# Patient Record
Sex: Male | Born: 1965 | Race: White | Hispanic: No | Marital: Single | Smoking: Current every day smoker
Health system: Southern US, Community
[De-identification: ages and names within clinical notes are randomized; demographics above are authoritative.]

## PROBLEM LIST (undated history)

## (undated) DIAGNOSIS — F329 Major depressive disorder, single episode, unspecified: Secondary | ICD-10-CM

## (undated) DIAGNOSIS — K219 Gastro-esophageal reflux disease without esophagitis: Secondary | ICD-10-CM

## (undated) DIAGNOSIS — F419 Anxiety disorder, unspecified: Secondary | ICD-10-CM

## (undated) DIAGNOSIS — F32A Depression, unspecified: Secondary | ICD-10-CM

## (undated) DIAGNOSIS — G8929 Other chronic pain: Secondary | ICD-10-CM

## (undated) DIAGNOSIS — M549 Dorsalgia, unspecified: Secondary | ICD-10-CM

## (undated) DIAGNOSIS — M25569 Pain in unspecified knee: Secondary | ICD-10-CM

## (undated) HISTORY — DX: Pain in unspecified knee: M25.569

## (undated) HISTORY — DX: Depression, unspecified: F32.A

## (undated) HISTORY — DX: Other chronic pain: G89.29

## (undated) HISTORY — PX: KNEE SURGERY: SHX244

## (undated) HISTORY — PX: BACK SURGERY: SHX140

## (undated) HISTORY — DX: Gastro-esophageal reflux disease without esophagitis: K21.9

## (undated) HISTORY — DX: Dorsalgia, unspecified: M54.9

## (undated) HISTORY — DX: Anxiety disorder, unspecified: F41.9

## (undated) HISTORY — DX: Major depressive disorder, single episode, unspecified: F32.9

---

## 1997-11-01 ENCOUNTER — Other Ambulatory Visit: Admission: RE | Admit: 1997-11-01 | Discharge: 1997-11-01 | Payer: Self-pay | Admitting: *Deleted

## 1997-11-06 ENCOUNTER — Other Ambulatory Visit: Admission: RE | Admit: 1997-11-06 | Discharge: 1997-11-06 | Payer: Self-pay | Admitting: *Deleted

## 1997-11-08 ENCOUNTER — Ambulatory Visit (HOSPITAL_COMMUNITY): Admission: RE | Admit: 1997-11-08 | Discharge: 1997-11-08 | Payer: Self-pay | Admitting: *Deleted

## 2000-01-28 ENCOUNTER — Emergency Department (HOSPITAL_COMMUNITY): Admission: EM | Admit: 2000-01-28 | Discharge: 2000-01-28 | Payer: Self-pay | Admitting: Emergency Medicine

## 2000-02-05 ENCOUNTER — Encounter: Payer: Self-pay | Admitting: Surgery

## 2000-02-05 ENCOUNTER — Ambulatory Visit (HOSPITAL_COMMUNITY): Admission: RE | Admit: 2000-02-05 | Discharge: 2000-02-05 | Payer: Self-pay | Admitting: Surgery

## 2000-12-24 ENCOUNTER — Emergency Department (HOSPITAL_COMMUNITY): Admission: EM | Admit: 2000-12-24 | Discharge: 2000-12-25 | Payer: Self-pay | Admitting: Emergency Medicine

## 2002-12-11 ENCOUNTER — Emergency Department (HOSPITAL_COMMUNITY): Admission: EM | Admit: 2002-12-11 | Discharge: 2002-12-11 | Payer: Self-pay | Admitting: Emergency Medicine

## 2002-12-15 ENCOUNTER — Emergency Department (HOSPITAL_COMMUNITY): Admission: EM | Admit: 2002-12-15 | Discharge: 2002-12-15 | Payer: Self-pay | Admitting: Emergency Medicine

## 2003-08-22 ENCOUNTER — Ambulatory Visit (HOSPITAL_BASED_OUTPATIENT_CLINIC_OR_DEPARTMENT_OTHER): Admission: RE | Admit: 2003-08-22 | Discharge: 2003-08-22 | Payer: Self-pay | Admitting: Orthopedic Surgery

## 2003-11-02 ENCOUNTER — Emergency Department (HOSPITAL_COMMUNITY): Admission: EM | Admit: 2003-11-02 | Discharge: 2003-11-02 | Payer: Self-pay | Admitting: Emergency Medicine

## 2005-04-17 ENCOUNTER — Emergency Department (HOSPITAL_COMMUNITY): Admission: EM | Admit: 2005-04-17 | Discharge: 2005-04-17 | Payer: Self-pay | Admitting: Family Medicine

## 2005-04-29 ENCOUNTER — Emergency Department (HOSPITAL_COMMUNITY): Admission: EM | Admit: 2005-04-29 | Discharge: 2005-04-29 | Payer: Self-pay | Admitting: Emergency Medicine

## 2005-08-02 ENCOUNTER — Emergency Department (HOSPITAL_COMMUNITY): Admission: AD | Admit: 2005-08-02 | Discharge: 2005-08-02 | Payer: Self-pay | Admitting: Family Medicine

## 2007-06-02 ENCOUNTER — Emergency Department (HOSPITAL_COMMUNITY): Admission: EM | Admit: 2007-06-02 | Discharge: 2007-06-02 | Payer: Self-pay | Admitting: Emergency Medicine

## 2008-01-16 ENCOUNTER — Emergency Department (HOSPITAL_COMMUNITY): Admission: EM | Admit: 2008-01-16 | Discharge: 2008-01-16 | Payer: Self-pay | Admitting: Emergency Medicine

## 2008-03-24 ENCOUNTER — Emergency Department (HOSPITAL_COMMUNITY): Admission: EM | Admit: 2008-03-24 | Discharge: 2008-03-24 | Payer: Self-pay | Admitting: Emergency Medicine

## 2008-03-29 ENCOUNTER — Emergency Department (HOSPITAL_COMMUNITY): Admission: EM | Admit: 2008-03-29 | Discharge: 2008-03-29 | Payer: Self-pay | Admitting: Emergency Medicine

## 2008-10-06 ENCOUNTER — Emergency Department (HOSPITAL_COMMUNITY): Admission: EM | Admit: 2008-10-06 | Discharge: 2008-10-06 | Payer: Self-pay | Admitting: Emergency Medicine

## 2009-01-13 ENCOUNTER — Emergency Department (HOSPITAL_COMMUNITY): Admission: EM | Admit: 2009-01-13 | Discharge: 2009-01-13 | Payer: Self-pay | Admitting: Emergency Medicine

## 2009-09-29 ENCOUNTER — Emergency Department (HOSPITAL_COMMUNITY): Admission: EM | Admit: 2009-09-29 | Discharge: 2009-09-29 | Payer: Self-pay | Admitting: Family Medicine

## 2009-11-15 ENCOUNTER — Emergency Department (HOSPITAL_COMMUNITY): Admission: EM | Admit: 2009-11-15 | Discharge: 2009-11-15 | Payer: Self-pay | Admitting: Emergency Medicine

## 2010-01-08 ENCOUNTER — Emergency Department (HOSPITAL_COMMUNITY): Admission: EM | Admit: 2010-01-08 | Discharge: 2010-01-08 | Payer: Self-pay | Admitting: Family Medicine

## 2010-03-12 ENCOUNTER — Encounter: Admission: RE | Admit: 2010-03-12 | Discharge: 2010-03-12 | Payer: Self-pay | Admitting: Orthopedic Surgery

## 2010-03-27 ENCOUNTER — Encounter: Admission: RE | Admit: 2010-03-27 | Discharge: 2010-03-27 | Payer: Self-pay | Admitting: Orthopedic Surgery

## 2010-07-23 ENCOUNTER — Other Ambulatory Visit (HOSPITAL_COMMUNITY): Payer: Self-pay | Admitting: Neurological Surgery

## 2010-07-23 ENCOUNTER — Encounter (HOSPITAL_COMMUNITY)
Admission: RE | Admit: 2010-07-23 | Discharge: 2010-07-23 | Disposition: A | Discharge status: Home / Self Care | Payer: BC Managed Care – PPO | Source: Ambulatory Visit | Attending: Neurological Surgery | Admitting: Neurological Surgery

## 2010-07-23 ENCOUNTER — Ambulatory Visit (HOSPITAL_COMMUNITY)
Admission: RE | Admit: 2010-07-23 | Discharge: 2010-07-23 | Disposition: A | Discharge status: Home / Self Care | Payer: BC Managed Care – PPO | Source: Ambulatory Visit | Attending: Neurological Surgery | Admitting: Neurological Surgery

## 2010-07-23 DIAGNOSIS — Z01818 Encounter for other preprocedural examination: Secondary | ICD-10-CM | POA: Insufficient documentation

## 2010-07-23 DIAGNOSIS — M545 Low back pain, unspecified: Secondary | ICD-10-CM | POA: Insufficient documentation

## 2010-07-23 DIAGNOSIS — Z01812 Encounter for preprocedural laboratory examination: Secondary | ICD-10-CM | POA: Insufficient documentation

## 2010-07-23 LAB — DIFFERENTIAL
Lymphs Abs: 1.8 10*3/uL (ref 0.7–4.0)
Neutro Abs: 3 10*3/uL (ref 1.7–7.7)

## 2010-07-23 LAB — BASIC METABOLIC PANEL
Calcium: 9.4 mg/dL (ref 8.4–10.5)
Chloride: 110 mEq/L (ref 96–112)
GFR calc Af Amer: >60 mL/min (ref >60)
Glucose, Bld: 98 mg/dL (ref 70–99)
Sodium: 141 mEq/L (ref 135–145)

## 2010-07-23 LAB — CBC
HCT: 47.3 % (ref 39.0–52.0)
MCV: 92.2 fL (ref 78.0–100.0)
Platelets: 242 10*3/uL (ref 150–400)
WBC: 5.9 10*3/uL (ref 4.0–10.5)

## 2010-07-23 LAB — PROTIME-INR
INR: 0.93 (ref 0.00–1.49)
Prothrombin Time: 12.7 seconds (ref 11.6–15.2)

## 2010-07-24 ENCOUNTER — Inpatient Hospital Stay (HOSPITAL_COMMUNITY): Payer: BC Managed Care – PPO

## 2010-07-24 ENCOUNTER — Inpatient Hospital Stay (HOSPITAL_COMMUNITY)
Admission: RE | Admit: 2010-07-24 | Discharge: 2010-07-28 | DRG: 756 | Disposition: A | Discharge status: Home / Self Care | Payer: BC Managed Care – PPO | Source: Ambulatory Visit | Attending: Neurological Surgery | Admitting: Neurological Surgery

## 2010-07-24 DIAGNOSIS — Q762 Congenital spondylolisthesis: Principal | ICD-10-CM

## 2010-07-25 NOTE — Op Note (Signed)
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  PROCEDURE: 1. Gill type decompression L5-S1 with hemifacetectomy and wide     generous foraminotomies for decompression of the L5 and the S1     nerve roots requiring much more work than is required of a simple     postprocedure to decompress the nerve roots. 2. Posterior lumbar interbody fusion L5-S1 utilizing 8 x 22 mm PEEK     interbody cages, packed with local autograft and Actifuse putty. 3. Intertransverse arthrodesis L5-S1 bilaterally utilizing local     autograft and Actifuse putty. 4. Nonsegmental fixation L5-S1 utilizing the [employer] Polaris pedicle     screw system.  SURGEON:  Tia Alert, MD  ASSISTANT:  Reinaldo Meeker, MD  ANESTHESIA:  General endotracheal.  COMPLICATIONS:  Small unintended durotomy on the right side repaired primarily.  BRIEF HISTORY OF PRESENT ILLNESS:  Mr. Hubbert is a 45 year old gentleman who was referred with severe left leg pain with weakness and atrophy in the leg, who had the pain for quite a long time and he tried medical management for quite some time without significant relief.  An MRI which showed a lytic pars defects at L5 with grade 1-2 spondylolisthesis at L5- S1 with severe degenerative disease and collapse of the disk space and significant foraminal stenosis.  Recommended a decompression instrumented fusion at L5-S1 to address the segmental instability and stenosis in hopes of improving his pain syndrome.  He  understood the risks, benefits, and expected outcome, and he wished to proceed.  DESCRIPTION OF PROCEDURE:  The patient was taken to operating room, after induction of adequate generalized endotracheal anesthesia, he was rolled into prone position on chest rolls and all pressure points were padded.  His lumbar region was cleaned with Hibiclens and then prepped with DuraPrep and then draped in the usual sterile fashion.  A 10 mL of local anesthesia was injected and a dorsal midline incision was made and carried down to the lumbosacral fascia.  The fascia was opened and the paraspinous musculature was taken down in subperiosteal fashion to expose L5-S1.  Intraoperative fluoroscopy confirmed my level.  I took the dissection out over the facets to expose the medial sacrum and the transverse processes of L5.  I was able to use a Leksell rongeur to remove the spinous process of L5 and then used the Leksell and the Kerrison punches to perform a complete laminectomy and hemifacetectomy of L5 to decompress the L5-S1 nerve roots.  He had significant compression of the left L5 nerve root in the foramen.  This was decompressed distally into the foramen until a coronary  dilator passed easily along the L5 nerve root.  The coronary dilator also passed easily along the other nerve roots.  Once my decompression was complete, I turned my attention to the nonsegmental fixation.  I localized the pedicle screw entry zones utilizing the surface landmarks and lateral fluoroscopy.  I probed each pedicle.  The pedicle probe tapped each pedicle with a 5 x 5 tap, palpated each pedicle with the ball probe to assure no break in the cortex, and then I placed a 65 x 45 mm pedicle screws in the L5 pedicles and 65 x 40 mm pedicle screws in the S1 pedicles bilaterally.  I then turned my attention to the clip at L5-S1. We incised the disk space bilaterally and distract the disk space up to 8 mm using a Kerrison punch to  try to bite away the superior overhang of S1.  I created a small unintended durotomy on the right-hand side, which was then repaired with a running 5-0 Prolene.  We then continued our PLIF using the rotating cutter, then the cutting chisel on the patient's left side where we had more rim around the nerve root.  I then placed a 8 x 22 mm PEEK interbody cage, packed with local autograft and Actifuse putty into the interspace on the left.  On the right-hand side, there was lesser rim to get into the disk space underneath the L5 nerve root, therefore, we wanted to use a bullet in cage and therefore we prepared the endplates with Epstein curettes and a rotating cutter, but used no cutting chisel, and then used a bullet in PEEK interbody cage and tapped this into position at L5-S1 on the right.  The midline had been packed with Actifuse putty and local autograft.  We packed bone into the lateral gutters after decorticating.  We placed lordotic rods into multiaxial screw heads.  We had the anesthesiologist perform a Valsalva maneuver to make sure we felt no evidence of CSF leak.  We then palpated along the nerve roots once again.  We then irrigated with copious amounts of bacitracin containing saline solution.  We lined the dura with Gelfoam, lined the dural repair with DuraSeal fibrin glue and then Gelfoam.  We placed a medium Hemovac drain through a separate stab incision and placed it on the left-hand side of the decompression, and then closed the muscle and the fascia with 0 Vicryl, closed the subcutaneous and subcuticular tissue with 2 and 3-0 Vicryl, closed the skin with benzoin and Steri-Strips.  The drapes were removed.  Sterile dressing was applied.  The patient was awakened from general anesthesia and transported to the recovery room in stable condition.  At the end of the procedure, all sponge, needle and instrument counts were correct.     Tia Alert, MD     DSJ/MEDQ  D:   07/24/2010  T:  07/25/2010  Job:  [id number]  Electronically Signed by Marikay Alar MD on 07/25/2010 05:03:07 PM

## 2010-08-08 NOTE — Discharge Summary (Signed)
  NAMEALEXIOS, Ronald Carter                 ACCOUNT NO.:  [id number]  MEDICAL RECORD NO.:  [id number]           PATIENT TYPE:  I  LOCATION:  3007                         FACILITY:  [facility]  PHYSICIAN:  Tia Alert, MD     DATE OF BIRTH:  1965/12/01  DATE OF ADMISSION:  07/24/2010 DATE OF DISCHARGE:  07/28/2010                              DISCHARGE SUMMARY   ADMITTING DIAGNOSIS:  Spondylolisthesis L5-S1 with left leg pain with weakness and atrophy.  PROCEDURE:  Posterior lumbar interbody fusion L5-S1.  BRIEF HISTORY OF PRESENT ILLNESS:  [name] is a 45 year old gentleman who presented with severe left leg pain with weakness and atrophy in his left leg.  He tried medical management for quite some time without significant relief.  He had an MRI, which showed spondylolisthesis at L5- S1.  I recommended posterior lumbar interbody fusion at that level.  He understood the risks, benefits, and expected outcome and wished to proceed.  HOSPITAL COURSE:  The patient was admitted on July 24, 2010, and taken to operating room and underwent a PLIF at L5-S1.  The patient tolerated the procedure well, was taken to the recovery room and then to the floor in stable condition.  For details of the operative procedure, please see the dictated operative note.  Unfortunately, there was a small unintended durotomy at the time of the surgery and he remained at flat bedrest for 2 days.  He was then mobilized with Physical and Occupational Therapy.  He had no evidence of headache once he was mobilized and his incision remained clean, dry, and intact during his hospital stay.  He remained afebrile with stable vital signs.  His pain became well-controlled on oral pain medications.  His Dilaudid PCA was stopped on July 27, 2010.  He was tolerating regular diet.  He was ambulating in the halls and doing quite well.  He had complete resolution of his left leg symptoms and had appropriate back  soreness. He was discharged home in stable condition on July 28, 2010, with plans to follow up in 2 weeks.  FINAL DIAGNOSIS:  Posterior lumbar interbody fusion, L5-S1.     Tia Alert, MD     DSJ/MEDQ  D:  07/28/2010  T:  07/28/2010  Job:  [id number]  Electronically Signed by Marikay Alar MD on 08/08/2010 08:17:26 AM

## 2010-08-21 ENCOUNTER — Inpatient Hospital Stay (HOSPITAL_COMMUNITY)
Admission: RE | Admit: 2010-08-21 | Payer: BC Managed Care – PPO | Source: Ambulatory Visit | Admitting: Neurological Surgery

## 2010-08-26 ENCOUNTER — Other Ambulatory Visit: Payer: Self-pay | Admitting: Neurological Surgery

## 2010-08-26 ENCOUNTER — Ambulatory Visit
Admission: RE | Admit: 2010-08-26 | Discharge: 2010-08-26 | Disposition: A | Discharge status: Home / Self Care | Payer: BC Managed Care – PPO | Source: Ambulatory Visit | Attending: Neurological Surgery | Admitting: Neurological Surgery

## 2010-08-26 DIAGNOSIS — M4310 Spondylolisthesis, site unspecified: Secondary | ICD-10-CM

## 2010-10-17 NOTE — Op Note (Signed)
NAMECASEY, FYE                       ACCOUNT NO.:  [id number]   MEDICAL RECORD NO.:  [id number]                   PATIENT TYPE:  AMB   LOCATION:  DSC                                  FACILITY:  [facility]   PHYSICIAN:  Feliberto Gottron. Turner Daniels, M.D.                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   POSTOPERATIVE DIAGNOSES:  Left knee medial meniscal tear with  chondromalacia.   OPERATION PERFORMED:  Left knee arthroscopic debridement of grade 4  chondromalacia over a 1 x 2 cm area of the distal femur, grade 4 and  globally over the distal tibia and partial medial meniscectomy, posterior  horn.   SURGEON:  Feliberto Gottron. Turner Daniels, M.D.   ASSISTANTLaural Benes. Jannet Mantis.   ANESTHESIA:  General LMA.   ESTIMATED BLOOD LOSS:  Minimal.   FLUIDS REPLACED:  800 mL crystalloids.   DRAINS:  None.   TOURNIQUET TIME:  None.   INDICATIONS FOR PROCEDURE:  The patient is a 45 year old man with medial  compartment pain of the left knee, plain x-ray showing loss of about half of  the articular cartilage extending along the medial jointline and he has  failed conservative treatment.  He desires elective arthroscopic evaluation  and treatment of his left knee for a presumed medial meniscal tear with  chondromalacia.   DESCRIPTION OF PROCEDURE:  The patient was identified by arm band and taken  to the operating room at [facility].  Appropriate  anesthetic monitors were attached.  General endotracheal was induced with  the patient in the supine position.  Lateral post applied to the table and  the left lower extremity prepped and draped in the usual sterile fashion  from the ankle to the midthigh.  Using a #11 blade, standard inferomedial  and inferolateral peripatellar portals were then made allowing  introduction  of the arthroscope through the inferolateral portal, outflow through the  inferomedial portal. The suprapatellar pouch, patella and trochlea were in  good condition, maybe a little bit of grade 1 to 2 chondromalacia patella  lightly debrided.  Going to the medial side, the medial tibial condyle had  little if any articular cartilage remaining to the weightbearing surface.  Focal chondromalacia grade 4 with flap tears to the distal femur and this  was debrided back to a stable margin.  The posterior horn of the medial  meniscus had some tearing about 30% of the rim and this was excised as well.  The ACL and the PCL were intact.  The lateral compartment was in excellent  condition.  The gutters were cleared. The scope was taken medial and lateral  to the PCL clearing the posterior compartments as well.  The knee was then  irrigated out  with normal saline solution.  The arthroscopic instrument was  removed.  A dressing of Xeroform, 4 x 4 dressing sponges, Webril and Ace  wrap was applied.  The patient was awakened and taken to the recovery room  without difficulty.                                               Feliberto Gottron. Turner Daniels, M.D.    Ovid Curd  D:  08/22/2003  T:  08/23/2003  Job:  [id number]

## 2010-10-17 NOTE — Consult Note (Signed)
[facility]  Patient:    Ronald Carter, Ronald Carter                         MRN: [id number] Proc. Date: 01/28/00 Attending:  Currie Paris, M.D. CC:         Nicky Pugh. Lenon Ahmadi, M.D.   Consultation Report  ACCOUNT NUMBER:  [id number]  REASON FOR CONSULTATION:  Diverticulitis.  CLINICAL HISTORY:  Dr. Oneta Rack asked me to see this patient who had been seen approximately 48 hours ago by Dr. Asa Saunas, his associate, for abdominal pain.  Today is Wednesday and, the preceding Friday, he developed lower abdominal pain, midline left lower quadrant.  It has persisted, and was fairly severe a couple of days ago, but seems to be better today.  He is sore, but really has been up and around with no distress.  His bowels have been a little bit loose and he feels better after he passes a lot of gas.  He has had no nausea or vomiting and he denies fever or chills.  He has never had any similar GI symptoms.  He has passed no blood through his rectum.  ALLERGIES:  No known drug allergies.  MEDICATIONS:  He is regularly on no medications.  Dr. Lenon Ahmadi gave him a prescription for some Bactrim, but he did not get that filled and has not taken any thus far.  He was also given a prescription for ketoconazole and Flexeril, neither of which he has picked up.  PAST SURGICAL HISTORY:  None.  REVIEW OF SYSTEMS:  HEENT: Negative.  CHEST: No cough or shortness of breath. HEART: No history of murmurs or hypertension.  ABDOMEN: Negative except for HPI.  GENITOURINARY: Negative.  EXTREMITIES: He has had some chronic back pain, for which Dr. Lenon Ahmadi prescribed the above mentioned Flexeril.  PHYSICAL EXAMINATION:  GENERAL:  The patient is an alert, healthy appearing 45 year old who is in no distress.  VITAL SIGNS:  Unremarkable.  He is afebrile.  HEENT:  Eyes clear and anicteric.  Pupils are round and regular.  NECK:  Supple.  There are no masses or thyromegaly.  LUNGS:  Clear to  auscultation.  HEART:  Regular. No murmurs, rubs, or gallops.  ABDOMEN:  Basically soft and nondistended, but he has definite left lower quadrant tenderness with no rebound.  There is no significant guarding.  Bowel sounds are normal.  RECTAL:  Not done.  EXTREMITIES:  No cyanosis or edema.  LABORATORY DATA:  His CMET is totally normal.  His white count shows hemoglobin 15.4 and white count 6000.  A CT scan was done at [facility].  I have reviewed those with our radiologists at [facility], and he has what appears to be a small fluid collection adjacent to his sigmoid consistent with small diverticular abscess.  IMPRESSION:  Apparent diverticulitis with some small confined abscess in a patient who is not clinically septic.  PLAN:  Put him on some Augmentin 875 b.i.d. and follow him up in my office in approximately 4-5 days.  We will, at that point, schedule him for repeat CT scan.  If he has any clinical deterioration, he is to call the office and is to be seen.  I have told him and his wife that it is very important that he take these antibiotics and that, should his diverticulitis get worse and he required emergency surgery, he would most likely require a colostomy.  I have also told him that, if he gets  any increase in pain, fever, chills, nausea, vomiting or has any questions about how he is doing, he should contact the office and, if I am not available, to talk to the on call physician.  I think all questions have been answered. DD:  01/28/00 TD:  01/29/00 Job: [id number]

## 2010-11-03 ENCOUNTER — Ambulatory Visit
Admission: RE | Admit: 2010-11-03 | Discharge: 2010-11-03 | Disposition: A | Discharge status: Home / Self Care | Payer: BC Managed Care – PPO | Source: Ambulatory Visit | Attending: Neurological Surgery | Admitting: Neurological Surgery

## 2010-11-03 ENCOUNTER — Other Ambulatory Visit: Payer: Self-pay | Admitting: Neurological Surgery

## 2010-11-03 DIAGNOSIS — M4310 Spondylolisthesis, site unspecified: Secondary | ICD-10-CM

## 2011-01-24 ENCOUNTER — Inpatient Hospital Stay (INDEPENDENT_AMBULATORY_CARE_PROVIDER_SITE_OTHER)
Admission: RE | Admit: 2011-01-24 | Discharge: 2011-01-24 | Disposition: A | Discharge status: Home / Self Care | Payer: BC Managed Care – PPO | Source: Ambulatory Visit | Attending: Family Medicine | Admitting: Family Medicine

## 2011-01-24 DIAGNOSIS — M1990 Unspecified osteoarthritis, unspecified site: Secondary | ICD-10-CM

## 2011-02-03 ENCOUNTER — Ambulatory Visit
Admission: RE | Admit: 2011-02-03 | Discharge: 2011-02-03 | Disposition: A | Discharge status: Home / Self Care | Payer: BC Managed Care – PPO | Source: Ambulatory Visit | Attending: Neurological Surgery | Admitting: Neurological Surgery

## 2011-02-03 ENCOUNTER — Other Ambulatory Visit: Payer: Self-pay | Admitting: Neurological Surgery

## 2011-02-03 DIAGNOSIS — M79609 Pain in unspecified limb: Secondary | ICD-10-CM

## 2011-02-03 DIAGNOSIS — M4310 Spondylolisthesis, site unspecified: Secondary | ICD-10-CM

## 2011-02-03 DIAGNOSIS — M5137 Other intervertebral disc degeneration, lumbosacral region: Secondary | ICD-10-CM

## 2011-02-05 ENCOUNTER — Other Ambulatory Visit: Payer: Self-pay | Admitting: Neurological Surgery

## 2011-02-05 DIAGNOSIS — M545 Low back pain: Secondary | ICD-10-CM

## 2011-02-05 DIAGNOSIS — M79605 Pain in left leg: Secondary | ICD-10-CM

## 2011-02-12 MED ORDER — DIAZEPAM 2 MG PO TABS
10.0000 mg | ORAL_TABLET | Freq: Once | ORAL | Status: AC
  Administered 2011-02-13: 10 mg via ORAL

## 2011-02-13 ENCOUNTER — Ambulatory Visit
Admission: RE | Admit: 2011-02-13 | Discharge: 2011-02-13 | Disposition: A | Discharge status: Home / Self Care | Payer: BC Managed Care – PPO | Source: Ambulatory Visit | Attending: Neurological Surgery | Admitting: Neurological Surgery

## 2011-02-13 ENCOUNTER — Other Ambulatory Visit: Payer: BC Managed Care – PPO

## 2011-02-13 DIAGNOSIS — M545 Low back pain: Secondary | ICD-10-CM

## 2011-02-13 DIAGNOSIS — M79605 Pain in left leg: Secondary | ICD-10-CM

## 2011-02-13 MED ORDER — HYDROMORPHONE HCL 2 MG/ML IJ SOLN
2.0000 mg | Freq: Once | INTRAMUSCULAR | Status: AC
  Administered 2011-02-13: 2 mg via INTRAMUSCULAR

## 2011-02-13 MED ORDER — IOHEXOL 180 MG/ML  SOLN
18.0000 mL | Freq: Once | INTRAMUSCULAR | Status: AC | PRN
  Administered 2011-02-13: 18 mL via INTRATHECAL

## 2011-02-13 MED ORDER — ONDANSETRON HCL 4 MG/2ML IJ SOLN
4.0000 mg | Freq: Once | INTRAMUSCULAR | Status: AC
  Administered 2011-02-13: 4 mg via INTRAMUSCULAR

## 2011-06-15 ENCOUNTER — Other Ambulatory Visit: Payer: Self-pay | Admitting: Orthopedic Surgery

## 2011-06-15 DIAGNOSIS — M25562 Pain in left knee: Secondary | ICD-10-CM

## 2011-06-18 ENCOUNTER — Ambulatory Visit
Admission: RE | Admit: 2011-06-18 | Discharge: 2011-06-18 | Disposition: A | Discharge status: Home / Self Care | Payer: BC Managed Care – PPO | Source: Ambulatory Visit | Attending: Orthopedic Surgery | Admitting: Orthopedic Surgery

## 2011-06-18 DIAGNOSIS — M25562 Pain in left knee: Secondary | ICD-10-CM

## 2012-05-31 IMAGING — CT CT L SPINE W/ CM
4 of 8 series · 14 of 33 positions shown, 16 images · IV contrast (omnipaque)
Comparison: Radiography 02/03/2011.  MRI 03/12/2010.

CLINICAL DATA: Low back and left leg pain.  Previous lumbosacral
fusion L5-S1.

 MYELOGRAM INJECTION
TECHNIQUE: Informed consent was obtained from the patient prior to
the procedure, including potential complications of headache,
allergy, infection and pain.  A timeout procedure was performed.
With the patient prone, the lower back was prepped with Betadine.
1% Lidocaine was used for local anesthesia.  Lumbar puncture was
performed at the right L2-3 level using a 22 gauge needle with
return of clear CSF.  18 ml of Omnipaque 018was injected into the
subarachnoid space .
TECHNIQUE: Following injection of intrathecal Omnipaque contrast,
spine imaging in multiple projections was performed using
fluoroscopy.
Fluoroscopy Time: 1 minute 9 seconds .
TECHNIQUE: CT imaging of the lumbar spine was performed after
intrathecal contrast administration.  Multiplanar CT image
reconstructions were also generated.

[Series 2: l spine bone · axial · 0.27mm/px · z∈[+26,+132]mm · 3 of 84 slices shown, 4 images]
[im 21/84  soft-tissue]
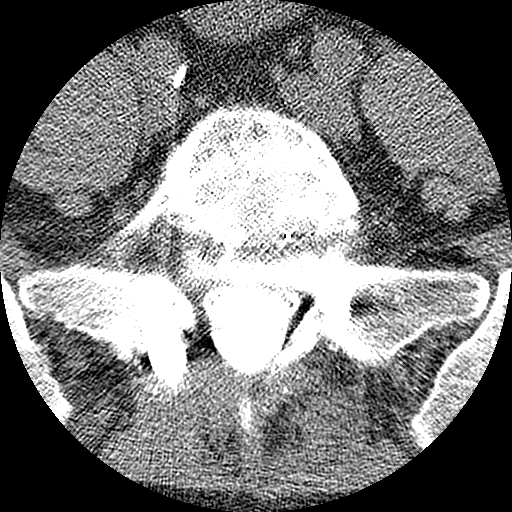
[im 21/84  bone]
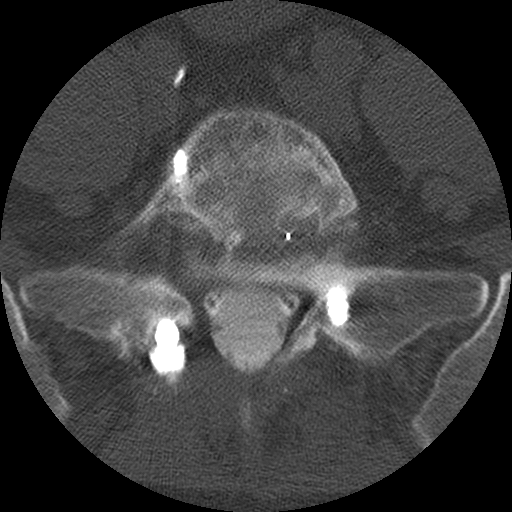
[im 42/84  bone]
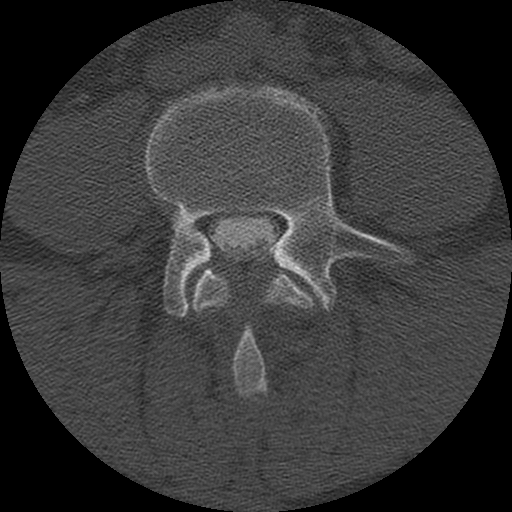
[im 63/84  bone]
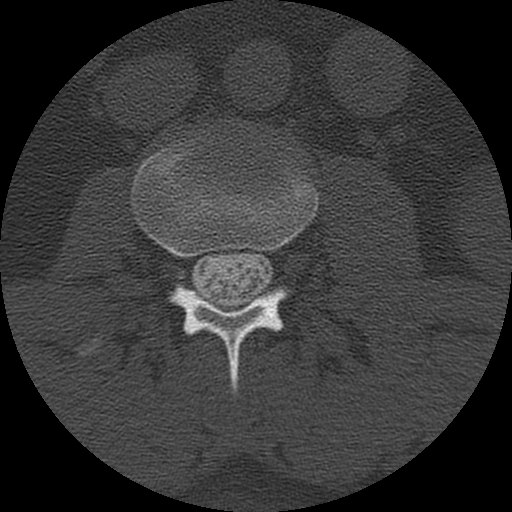

[Series 3: l spine soft · axial · 0.27mm/px · z∈[+26,+132]mm · 3 of 84 slices shown]
[im 21/84  soft-tissue]
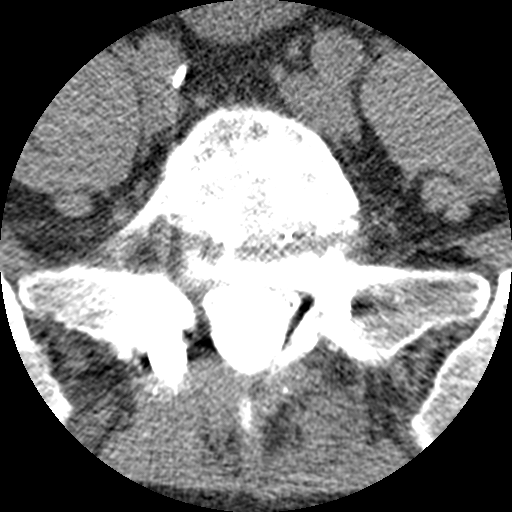
[im 42/84  soft-tissue]
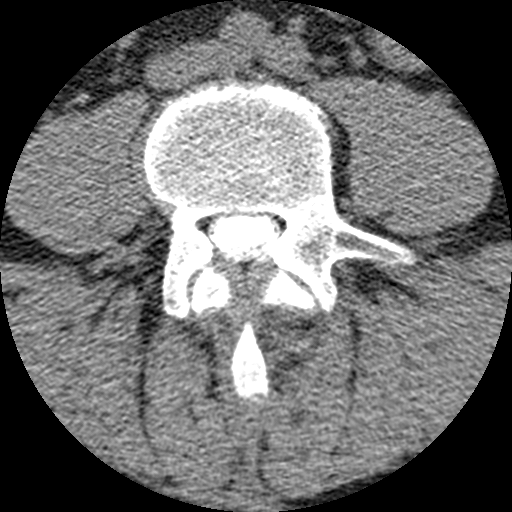
[im 63/84  soft-tissue]
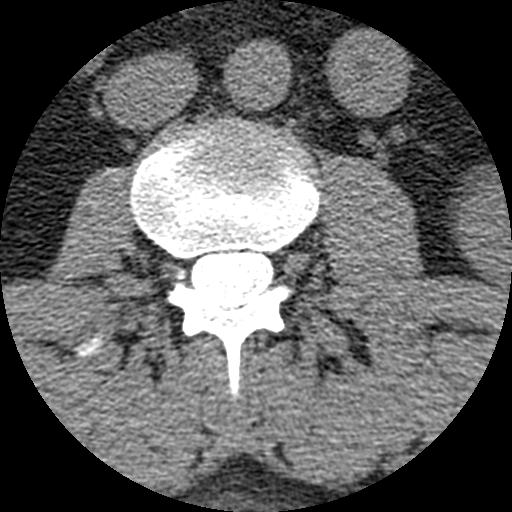

[Series 400: cor · coronal · 0.42mm/px · 3 of 53 slices shown]
[im 11/53  bone]
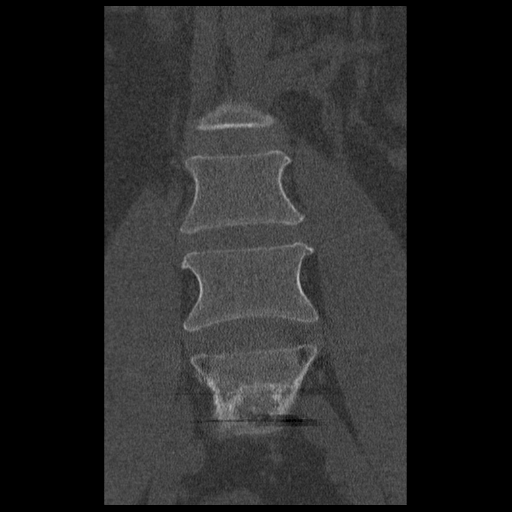
[im 21/53  bone]
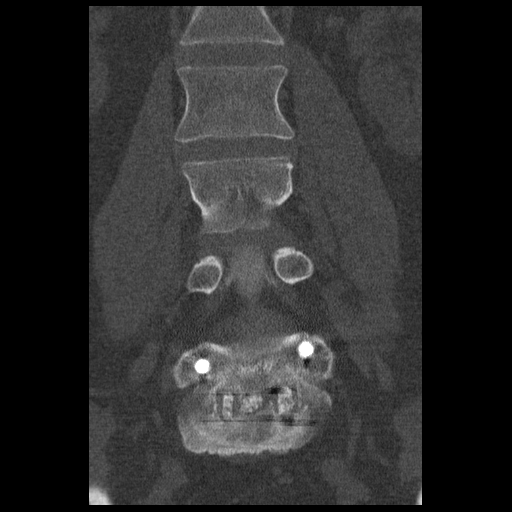
[im 32/53  bone]
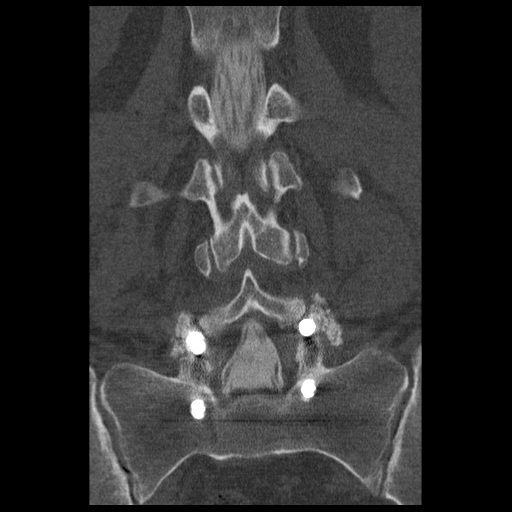

[Series 401: sag · sagittal · 0.42mm/px · 5 of 53 slices shown, 6 images]
[im 18/53  bone]
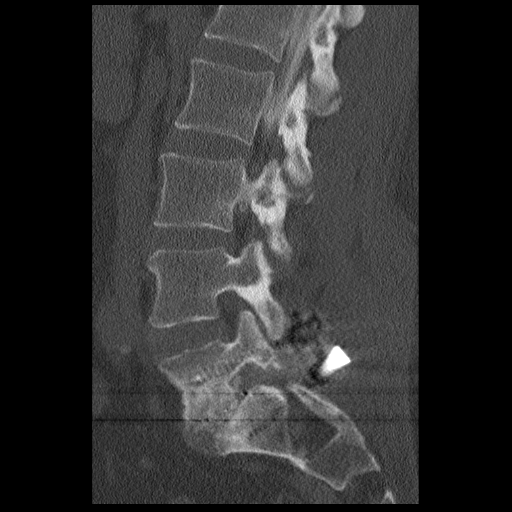
[im 22/53  bone]
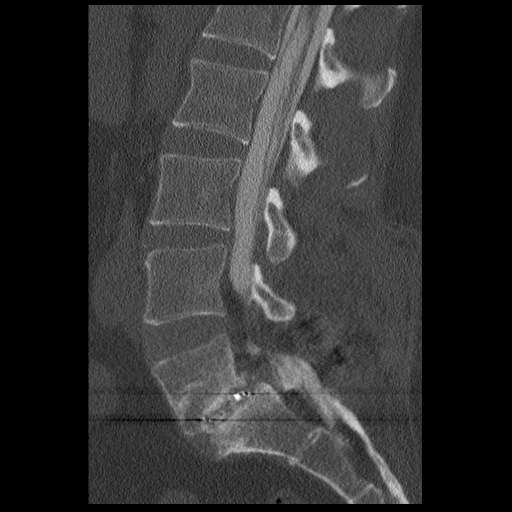
[im 27/53  soft-tissue]
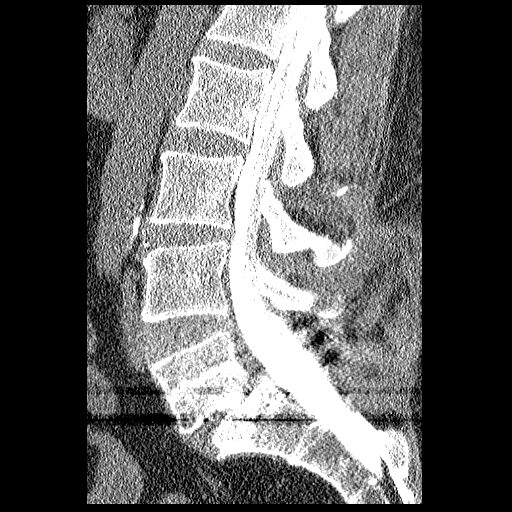
[im 27/53  bone]
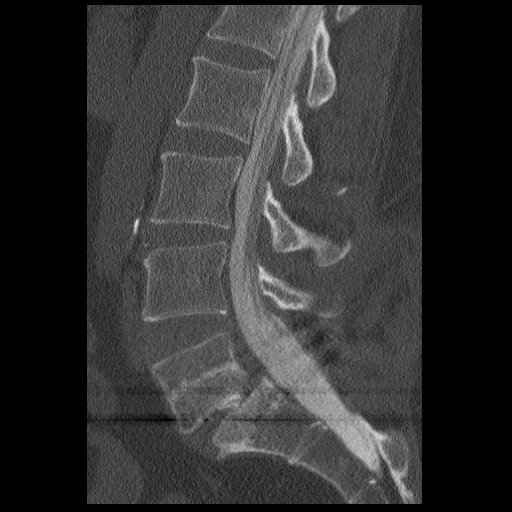
[im 31/53  bone]
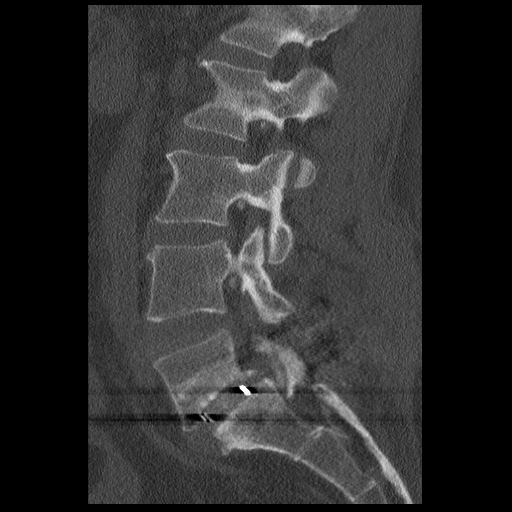
[im 35/53  bone]
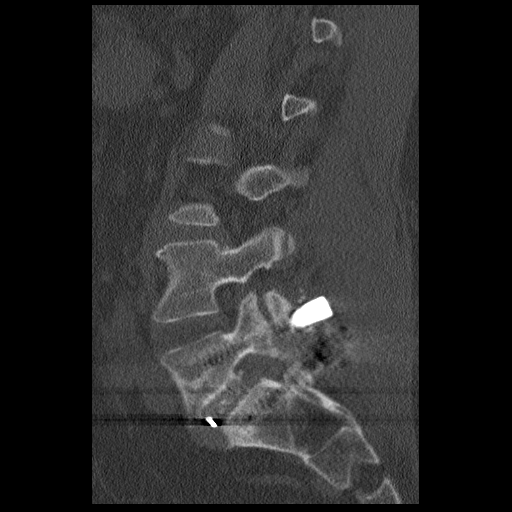

[14 of 33 positions shown; findings below may reference images not displayed]

IMPRESSION: Successful injection of  intrathecal contrast for myelography.

MYELOGRAM LUMBAR
FINDINGS: L1-2, L2-3 and L3-4 are normal except for a mild disc
bulge at L3-4.  At L4-5, there is no anterior extradural defect or
evidence of neural compression.  At L5-S1, there has been previous
discectomy and fusion.  Interbody fusion material has a good
appearance.  Bilateral pedicle screws are in place with posterior
rods.  No sign of loosening.  No stenosis.

Standing flexion and extension views do not show any motion at the
fusion level or abnormal motion otherwise.
IMPRESSION: Normal myelographic appearance following discectomy and fusion at
L5-S1. There is mild disc bulge at L3-4 but no stenosis.  No
abnormal motion.

CT MYELOGRAPHY LUMBAR SPINE
FINDINGS: L1-2 and L2-3:  Normal.

L3-4:  Minimal bulging of the disc.  No stenosis or neural
compression.

L4-5:  Normal appearance of the disc.  Canal and foramina widely
patent.  No facet arthropathy or foraminal stenosis.

L5-S1:  Previous posterior decompression with discectomy and
fusion.  Fusion appears solid.  Pedicle screws are well positioned.
No peri screw lucency.  There is epidural fibrosis surrounding the
thecal sac.  Neural foramina show mild narrowing because of fixed
anterolisthesis and encroachment by osteophytes.  There would be
potential to affect the L5 nerve roots.  No S1 compression is
demonstrated.

There are degenerative changes of the sacroiliac joints.
IMPRESSION: Good appearance at L4-5 and above.

L5-S1:  Solid fusion without evidence of motion.  L5 is fused 3 mm
anterior on S1.  The central canal is widely patent and there is no
S1 nerve compression.  The foramina show some encroachment by
osteophytes there appears to be epidural fibrosis.  It is possible
that the L5 nerve roots could be affected in the foramina, though
definite neural compression is not demonstrated.

## 2013-01-11 ENCOUNTER — Emergency Department (HOSPITAL_COMMUNITY): Payer: Self-pay

## 2013-01-11 ENCOUNTER — Encounter (HOSPITAL_COMMUNITY): Payer: Self-pay | Admitting: Emergency Medicine

## 2013-01-11 ENCOUNTER — Emergency Department (HOSPITAL_COMMUNITY)
Admission: EM | Admit: 2013-01-11 | Discharge: 2013-01-11 | Disposition: A | Discharge status: Home / Self Care | Payer: Self-pay | Attending: Emergency Medicine | Admitting: Emergency Medicine

## 2013-01-11 DIAGNOSIS — M549 Dorsalgia, unspecified: Secondary | ICD-10-CM

## 2013-01-11 DIAGNOSIS — Y929 Unspecified place or not applicable: Secondary | ICD-10-CM | POA: Insufficient documentation

## 2013-01-11 DIAGNOSIS — F17200 Nicotine dependence, unspecified, uncomplicated: Secondary | ICD-10-CM | POA: Insufficient documentation

## 2013-01-11 DIAGNOSIS — Y9389 Activity, other specified: Secondary | ICD-10-CM | POA: Insufficient documentation

## 2013-01-11 DIAGNOSIS — Z9889 Other specified postprocedural states: Secondary | ICD-10-CM | POA: Insufficient documentation

## 2013-01-11 DIAGNOSIS — IMO0002 Reserved for concepts with insufficient information to code with codable children: Secondary | ICD-10-CM | POA: Insufficient documentation

## 2013-01-11 DIAGNOSIS — Z79899 Other long term (current) drug therapy: Secondary | ICD-10-CM | POA: Insufficient documentation

## 2013-01-11 DIAGNOSIS — X500XXA Overexertion from strenuous movement or load, initial encounter: Secondary | ICD-10-CM | POA: Insufficient documentation

## 2013-01-11 MED ORDER — KETOROLAC TROMETHAMINE 30 MG/ML IJ SOLN
30.0000 mg | Freq: Once | INTRAMUSCULAR | Status: AC
  Administered 2013-01-11: 30 mg via INTRAMUSCULAR
  Filled 2013-01-11: qty 1

## 2013-01-11 MED ORDER — METHOCARBAMOL 500 MG PO TABS: 500.0000 mg | ORAL_TABLET | Freq: Two times a day (BID) | ORAL | Status: DC

## 2013-01-11 MED ORDER — ONDANSETRON 4 MG PO TBDP
4.0000 mg | ORAL_TABLET | Freq: Once | ORAL | Status: AC
  Administered 2013-01-11: 4 mg via ORAL
  Filled 2013-01-11: qty 1

## 2013-01-11 MED ORDER — HYDROMORPHONE HCL PF 1 MG/ML IJ SOLN
1.0000 mg | Freq: Once | INTRAMUSCULAR | Status: AC
  Administered 2013-01-11: 1 mg via INTRAMUSCULAR
  Filled 2013-01-11: qty 1

## 2013-01-11 MED ORDER — PREDNISONE 10 MG PO TABS: 20.0000 mg | ORAL_TABLET | Freq: Every day | ORAL | Status: DC

## 2013-01-11 MED ORDER — OXYCODONE HCL 5 MG PO CAPS: 5.0000 mg | ORAL_CAPSULE | Freq: Four times a day (QID) | ORAL | Status: DC | PRN

## 2013-01-11 NOTE — ED Provider Notes (Signed)
CSN: [id number]     Arrival date & time 01/11/13  1745 History    This chart was scribed for non-physician practitioner, Marlon Pel PA-C,  working with Layla Maw Ward, DO by Ashley Jacobs, ED scribe. This patient was seen in room WTR5/WTR5 and the patient's care was started at 6:19 PM.     Chief Complaint  Patient presents with  . Back Pain   HPI HPI Comments: Ronald Carter is a 47 y.o. male who presents to the Emergency Department complaining of moderate radiating lower left back pain that radiates to left buttock and left groin after lifting 15 lb commode and upon standing up the night PTA. Pt mentioned that he applied an ice and heat pack in rotations and had ibuprofen with no relief. Pt has a hx of back surgery by Dr. Yetta Barre and Dr. August Saucer and mentioned that pain is similar to the back injury and the pain is gradually worsening. He reports that nothing seems to relieve the pain. He also denies any loss of bowel or urinary control.   History reviewed. No pertinent past medical history. Past Surgical History  Procedure Laterality Date  . Back surgery     History reviewed. No pertinent family history. History  Substance Use Topics  . Smoking status: Current Every Day Smoker  . Smokeless tobacco: Not on file  . Alcohol Use: Yes    Review of Systems  Musculoskeletal: Positive for myalgias and back pain.  All other systems reviewed and are negative.    Allergies  Review of patient's allergies indicates no known allergies.  Home Medications   Current Outpatient Rx  Name  Route  Sig  Dispense  Refill  . diphenhydrAMINE (BENADRYL) 25 mg capsule   Oral   Take 25 mg by mouth every 6 (six) hours as needed for itching.         Marland Kitchen ibuprofen (ADVIL,MOTRIN) 200 MG tablet   Oral   Take 200 mg by mouth every 6 (six) hours as needed for pain.         Marland Kitchen lansoprazole (PREVACID) 30 MG capsule   Oral   Take 30 mg by mouth daily.         . pseudoephedrine (SUDAFED) 30 MG  tablet   Oral   Take 30 mg by mouth every 4 (four) hours as needed for congestion.          BP 130/77  Pulse 92  Temp(Src) 97.4 F (36.3 C) (Oral)  Resp 17  SpO2 98% Physical Exam  Nursing note and vitals reviewed. Constitutional: He appears well-developed and well-nourished. No distress.  HENT:  Head: Normocephalic and atraumatic.  Eyes: Pupils are equal, round, and reactive to light.  Neck: Normal range of motion. Neck supple.  Cardiovascular: Normal rate and regular rhythm.   Pulmonary/Chest: Effort normal.  Abdominal: Soft.  Musculoskeletal:       Lumbar back: He exhibits decreased range of motion, tenderness, bony tenderness, swelling, pain and spasm. He exhibits no edema, no deformity, no laceration and normal pulse.       Back:  Neurological: He is alert.  Skin: Skin is warm and dry.    ED Course  DIAGNOSTIC STUDIES: Oxygen Saturation is 98% on room air, normal by my interpretation.    COORDINATION OF CARE: 6:24 PM Discussed course of care with pt . Pt understands and agrees.     Procedures (including critical care time)  Labs Reviewed - No data to display Dg Lumbar Spine Complete  01/11/2013   *RADIOLOGY REPORT*  Clinical Data: Low back and left lower extremity pain post lifting injury.  LUMBAR SPINE - COMPLETE 4+ VIEW  Comparison: CT 02/13/2011  Findings: Previous PLIF L5-S1, hardware intact without surrounding lucency.  Graft markers project in the interspace.  Normal alignment.  Negative for fracture.  Small anterior endplate spurs noted in the visualized lower thoracic spine and L3-4.  There is mild narrowing of the L3-4 interspace.  Patchy aortic calcifications.  IMPRESSION: 1.  Negative for fracture or other acute bony abnormality. 2.  Mild narrowing of the  L3-4 interspace. 3.  Stable postop changes L5-S1.   Original Report Authenticated By: D. Andria Rhein, MD   1. Back pain     MDM  Pt given IM Dilaudid 1 mg and 30 mg IM Toradol  It helped relieve  his pain from 9/10 to 2/10. He is able to walk with a better gait now. Films show that the patient does not have a disruption of his surgical hardware. No gross deformities present. Patient is to followup with Dr. Yetta Barre this week.   Will give a prescription for oxycodone, Robaxin and a prednisone Dose pack.  Patient with back pain. No neurological deficits. Patient is ambulatory. No warning symptoms of back pain including: loss of bowel or bladder control, night sweats, waking from sleep with back pain, unexplained fevers or weight loss, h/o cancer, IVDU, recent significant  trauma. No concern for cauda equina, epidural abscess, or other serious cause of back pain. Conservative measures such as rest, ice/heat and pain medicine indicated with PCP follow-up if no improvement with conservative management.     47 y.o.Ronald Carter's evaluation in the Emergency Department is complete. It has been determined that no acute conditions requiring further emergency intervention are present at this time. The patient/guardian have been advised of the diagnosis and plan. We have discussed signs and symptoms that warrant return to the ED, such as changes or worsening in symptoms.  Vital signs are stable at discharge. Filed Vitals:   01/11/13 1755  BP: 130/77  Pulse: 92  Temp: 97.4 F (36.3 C)  Resp: 17    Patient/guardian has voiced understanding and agreed to follow-up with the PCP or specialist.  I personally performed the services described in this documentation, which was scribed in my presence. The recorded information has been reviewed and is accurate.   Dorthula Matas, PA-C 01/11/13 1921

## 2013-01-11 NOTE — ED Notes (Signed)
Pt c/o lower back pain x1 day.  Reports picking up a toilet last night and that's when pain worsened.  Reports no relief with ibuprofen.

## 2013-01-11 NOTE — ED Provider Notes (Signed)
Medical screening examination/treatment/procedure(s) were performed by non-physician practitioner and as supervising physician I was immediately available for consultation/collaboration.  Layla Maw Damesha Lawler, DO 01/11/13 2336

## 2013-01-19 ENCOUNTER — Encounter (HOSPITAL_COMMUNITY): Payer: Self-pay | Admitting: *Deleted

## 2013-01-19 ENCOUNTER — Emergency Department (HOSPITAL_COMMUNITY): Payer: Self-pay

## 2013-01-19 ENCOUNTER — Emergency Department (HOSPITAL_COMMUNITY)
Admission: EM | Admit: 2013-01-19 | Discharge: 2013-01-20 | Disposition: A | Discharge status: Home / Self Care | Payer: Self-pay | Attending: Emergency Medicine | Admitting: Emergency Medicine

## 2013-01-19 DIAGNOSIS — Z9889 Other specified postprocedural states: Secondary | ICD-10-CM | POA: Insufficient documentation

## 2013-01-19 DIAGNOSIS — M549 Dorsalgia, unspecified: Secondary | ICD-10-CM

## 2013-01-19 DIAGNOSIS — N442 Benign cyst of testis: Secondary | ICD-10-CM

## 2013-01-19 DIAGNOSIS — F17200 Nicotine dependence, unspecified, uncomplicated: Secondary | ICD-10-CM | POA: Insufficient documentation

## 2013-01-19 DIAGNOSIS — M545 Low back pain, unspecified: Secondary | ICD-10-CM | POA: Insufficient documentation

## 2013-01-19 DIAGNOSIS — Z79899 Other long term (current) drug therapy: Secondary | ICD-10-CM | POA: Insufficient documentation

## 2013-01-19 DIAGNOSIS — N508 Other specified disorders of male genital organs: Secondary | ICD-10-CM | POA: Insufficient documentation

## 2013-01-19 LAB — URINALYSIS, ROUTINE W REFLEX MICROSCOPIC
Ketones, ur: NEGATIVE mg/dL
Leukocytes, UA: NEGATIVE
Nitrite: NEGATIVE
Protein, ur: NEGATIVE mg/dL
Urobilinogen, UA: 0.2 mg/dL (ref 0.0–1.0)

## 2013-01-19 MED ORDER — IBUPROFEN 200 MG PO TABS
600.0000 mg | ORAL_TABLET | Freq: Once | ORAL | Status: AC
  Administered 2013-01-19: 600 mg via ORAL
  Filled 2013-01-19: qty 1

## 2013-01-19 MED ORDER — DIAZEPAM 5 MG PO TABS
5.0000 mg | ORAL_TABLET | Freq: Once | ORAL | Status: AC
  Administered 2013-01-19: 5 mg via ORAL
  Filled 2013-01-19: qty 1

## 2013-01-19 MED ORDER — OXYCODONE-ACETAMINOPHEN 5-325 MG PO TABS
2.0000 | ORAL_TABLET | Freq: Once | ORAL | Status: AC
  Administered 2013-01-19: 2 via ORAL
  Filled 2013-01-19: qty 2

## 2013-01-19 NOTE — ED Notes (Signed)
Pt states having severe lower back pain, states was outside spraying the water hose, states has hx of back surgery and was recently here for back pain and it got better until other day.

## 2013-01-19 NOTE — ED Provider Notes (Signed)
MSE was initiated and I personally evaluated the patient and placed orders (if any) at  10:34 PM on January 19, 2013.  Ronald Carter is a 47 year old male with past medical history of back pain, postsurgical procedure of L5-S1, presenting to the emergency department with ongoing back pain. Patient was recently seen at [facility] department on 01/11/2013 for back pain, was treated with IM diluted in IM Toradol he was discharged with prednisone, pain medications, or boxing. Patient reported that the back pain is localized to the lower lumbosacral region reported that the pain became worse this morning while patient was spraying water. Patient reported that the lower back pain feels like he just got out of surgery, reported that his spine burns. Wife reported that the lower back a swollen and extremely hot to the touch. Patient reported that he's been having ongoing left testicular and left groin pain. Patient reported that the pain is in the testicles described as basically an aching sensation. Patient reported he has had mild testicular swelling noted. Patient reported that he has been having ongoing hot and cold chills. Wife reported that she inspected the testicle yesterday and stated that she felt a "twist" in the testicle. Patient denied fall, injury, urinary and bowel incontinence, numbness, tingling to the legs, chest pain, shortness of breath, difficulty breathing. The patient appears stable so that the remainder of the MSE may be completed by another provider.  Raymon Mutton, PA-C 01/21/13 1101

## 2013-01-20 MED ORDER — OXYCODONE-ACETAMINOPHEN 5-325 MG PO TABS: 2.0000 | ORAL_TABLET | ORAL | Status: DC | PRN

## 2013-01-20 MED ORDER — DIAZEPAM 5 MG PO TABS: 5.0000 mg | ORAL_TABLET | Freq: Four times a day (QID) | ORAL | Status: DC | PRN

## 2013-01-20 MED ORDER — OXYCODONE-ACETAMINOPHEN 5-325 MG PO TABS: 1.0000 | ORAL_TABLET | Freq: Four times a day (QID) | ORAL | Status: DC | PRN

## 2013-01-20 MED ORDER — IBUPROFEN 600 MG PO TABS: 600.0000 mg | ORAL_TABLET | Freq: Three times a day (TID) | ORAL | Status: DC | PRN

## 2013-01-20 MED ORDER — DIAZEPAM 5 MG PO TABS: 5.0000 mg | ORAL_TABLET | Freq: Three times a day (TID) | ORAL | Status: DC | PRN

## 2013-01-20 MED ORDER — IBUPROFEN 600 MG PO TABS: 600.0000 mg | ORAL_TABLET | Freq: Four times a day (QID) | ORAL | Status: DC | PRN

## 2013-01-20 NOTE — ED Provider Notes (Signed)
CSN: [id number]     Arrival date & time 01/19/13  2006 History     First MD Initiated Contact with Patient 01/19/13 2111     Chief Complaint  Patient presents with  . Back Pain   (Consider location/radiation/quality/duration/timing/severity/associated sxs/prior Treatment) HPI 47 yo male presents to the ER from home with complaint of severe lower back pain worsening two days ago while washing his house with a hose.  Pt has h/o back surgery with Dr Yetta Barre about a year ago, has had persistent but manageable pain until about a week ago when he had a flare up of pain.  He was seen in the ER, given pain medication.  He reports pain was getting better until two days ago.  Pt denies any bowel or bladder dysfunction, no leg weakness.  Pain starts at his incision site in lower lumbar area, radiates down his left leg and into his left testicle.  He has had some burning pain extending upward from his incision site.  Wife and husband report warmth to the back.  Pt reports pain into left testicle.  Wife reported initially she felt a twist, but now reports she is concerned about a torsion given his pain. Pt reports pain is not severe in the testicle, only a dull ache.  He feels he has had intermittent swelling in the left testicle.    History reviewed. No pertinent past medical history. Past Surgical History  Procedure Laterality Date  . Back surgery     No family history on file. History  Substance Use Topics  . Smoking status: Current Every Day Smoker  . Smokeless tobacco: Not on file  . Alcohol Use: Yes    Review of Systems  All other systems reviewed and are negative.    Allergies  Review of patient's allergies indicates no known allergies.  Home Medications   Current Outpatient Rx  Name  Route  Sig  Dispense  Refill  . diphenhydrAMINE (BENADRYL) 25 mg capsule   Oral   Take 25 mg by mouth every 6 (six) hours as needed for itching.         Marland Kitchen ibuprofen (ADVIL,MOTRIN) 200 MG tablet  Oral   Take 200 mg by mouth every 6 (six) hours as needed for pain.         Marland Kitchen lansoprazole (PREVACID) 30 MG capsule   Oral   Take 30 mg by mouth daily.         . pseudoephedrine (SUDAFED) 30 MG tablet   Oral   Take 30 mg by mouth every 4 (four) hours as needed for congestion.         . diazepam (VALIUM) 5 MG tablet   Oral   Take 1 tablet (5 mg total) by mouth every 8 (eight) hours as needed (muscle spasm).   30 tablet   0   . ibuprofen (ADVIL,MOTRIN) 600 MG tablet   Oral   Take 1 tablet (600 mg total) by mouth every 6 (six) hours as needed for pain.   30 tablet   0   . oxyCODONE-acetaminophen (PERCOCET/ROXICET) 5-325 MG per tablet   Oral   Take 2 tablets by mouth every 4 (four) hours as needed for pain.   30 tablet   0    BP 133/68  Pulse 75  Temp(Src) 97.7 F (36.5 C) (Oral)  Resp 18  Ht 5' 11.5" (1.816 m)  Wt 250 lb (113.399 kg)  BMI 34.39 kg/m2  SpO2 96% Physical Exam  Nursing note  and vitals reviewed. Constitutional: He is oriented to person, place, and time. He appears well-developed and well-nourished.  HENT:  Head: Normocephalic and atraumatic.  Nose: Nose normal.  Mouth/Throat: Oropharynx is clear and moist.  Eyes: Conjunctivae and EOM are normal. Pupils are equal, round, and reactive to light.  Neck: Normal range of motion. Neck supple. No JVD present. No tracheal deviation present. No thyromegaly present.  Cardiovascular: Normal rate, regular rhythm, normal heart sounds and intact distal pulses.  Exam reveals no gallop and no friction rub.   No murmur heard. Pulmonary/Chest: Effort normal and breath sounds normal. No stridor. No respiratory distress. He has no wheezes. He has no rales. He exhibits no tenderness.  Abdominal: Soft. Bowel sounds are normal. He exhibits no distension and no mass. There is no tenderness. There is no rebound and no guarding.  Genitourinary:  Normal testicle lie.  No masses, mild ttp over left testicle  Musculoskeletal:  Normal range of motion. He exhibits tenderness (ttp to entire spine, worse over lumbar and left paraspinal). He exhibits no edema.  Lymphadenopathy:    He has no cervical adenopathy.  Neurological: He is alert and oriented to person, place, and time. He has normal reflexes. No cranial nerve deficit. He exhibits normal muscle tone. Coordination normal.  Skin: Skin is dry. No rash noted. No erythema. No pallor.  Psychiatric: He has a normal mood and affect. His behavior is normal. Judgment and thought content normal.    ED Course   Procedures (including critical care time)  Labs Reviewed  URINALYSIS, ROUTINE W REFLEX MICROSCOPIC - Abnormal; Notable for the following:    Specific Gravity, Urine 1.037 (*)    Bilirubin Urine SMALL (*)    All other components within normal limits   US Scrotum  01/20/2013   *RADIOLOGY REPORT*  Clinical Data:  Left testicular pain.  SCROTAL ULTRASOUND DOPPLER ULTRASOUND OF THE TESTICLES  Technique: Complete ultrasound examination of the testicles, epididymis, and other scrotal structures was performed.  Color and spectral Doppler ultrasound were also utilized to evaluate blood flow to the testicles.  Comparison:  None.  Findings:  Right testis:  Right testis measures 4.6 x 2.2 x 2.8 cm.  Normal testicular parenchymal echotexture.  No focal mass lesions demonstrated.  Normal homogeneous flow distribution on color flow Doppler imaging.  Left testis:  Left testis measures 4.5 x 2.3 x 2.8 cm.  A small circumscribed low attenuation lesion is demonstrated in the anterior mid pole of the testis measuring 3.5 x 2.1 x 3.6 mm.  No flow is demonstrated in this lesion.  Appearance is consistent with a simple cyst.  No solid mass lesions demonstrated in the left testis.  Parenchymal echotexture is otherwise homogeneous.  Normal homogeneous flow distribution on color flow Doppler imaging.  Right epididymis:  Normal in size and appearance.  Left epididymis:  Normal in size and  appearance.  Hydrocele:  No scrotal hydroceles demonstrated.  Varicocele:  No scrotal varicoceles demonstrated.  Pulsed Doppler interrogation of both testes demonstrates low resistance flow bilaterally.  IMPRESSION: Small simple appearing cyst in the left testis, likely benign. Testicular parenchymal echotexture is otherwise homogeneous and normal bilaterally.  No evidence of torsion or epididymo-orchitis.   Original Report Authenticated By: Burman Nieves, M.D.   [place] Art/ven Flow Abd Pelv Doppler  01/20/2013   *RADIOLOGY REPORT*  Clinical Data:  Left testicular pain.  SCROTAL ULTRASOUND DOPPLER ULTRASOUND OF THE TESTICLES  Technique: Complete ultrasound examination of the testicles, epididymis, and other scrotal structures was  performed.  Color and spectral Doppler ultrasound were also utilized to evaluate blood flow to the testicles.  Comparison:  None.  Findings:  Right testis:  Right testis measures 4.6 x 2.2 x 2.8 cm.  Normal testicular parenchymal echotexture.  No focal mass lesions demonstrated.  Normal homogeneous flow distribution on color flow Doppler imaging.  Left testis:  Left testis measures 4.5 x 2.3 x 2.8 cm.  A small circumscribed low attenuation lesion is demonstrated in the anterior mid pole of the testis measuring 3.5 x 2.1 x 3.6 mm.  No flow is demonstrated in this lesion.  Appearance is consistent with a simple cyst.  No solid mass lesions demonstrated in the left testis.  Parenchymal echotexture is otherwise homogeneous.  Normal homogeneous flow distribution on color flow Doppler imaging.  Right epididymis:  Normal in size and appearance.  Left epididymis:  Normal in size and appearance.  Hydrocele:  No scrotal hydroceles demonstrated.  Varicocele:  No scrotal varicoceles demonstrated.  Pulsed Doppler interrogation of both testes demonstrates low resistance flow bilaterally.  IMPRESSION: Small simple appearing cyst in the left testis, likely benign. Testicular parenchymal echotexture is  otherwise homogeneous and normal bilaterally.  No evidence of torsion or epididymo-orchitis.   Original Report Authenticated By: Burman Nieves, M.D.   1. Back pain   2. Testicular cyst     MDM  47 yo male with acute on chronic back pain, left testicle pain.  Cyst noted on u/s, pt and wife updated on findings.  Pain improved with medications.  Will have him f/u with Dr Yetta Barre and urology.  Olivia Mackie, MD 01/20/13 [phone]

## 2013-01-21 NOTE — ED Provider Notes (Signed)
I was available for consult during the screening exam.  Gerhard Munch, MD 01/21/13 2302

## 2013-04-24 ENCOUNTER — Emergency Department (HOSPITAL_COMMUNITY): Payer: Self-pay

## 2013-04-24 ENCOUNTER — Encounter (HOSPITAL_COMMUNITY): Payer: Self-pay | Admitting: Emergency Medicine

## 2013-04-24 ENCOUNTER — Emergency Department (HOSPITAL_COMMUNITY)
Admission: EM | Admit: 2013-04-24 | Discharge: 2013-04-24 | Disposition: A | Discharge status: Home / Self Care | Payer: Self-pay | Attending: Emergency Medicine | Admitting: Emergency Medicine

## 2013-04-24 DIAGNOSIS — F17200 Nicotine dependence, unspecified, uncomplicated: Secondary | ICD-10-CM | POA: Insufficient documentation

## 2013-04-24 DIAGNOSIS — Z79899 Other long term (current) drug therapy: Secondary | ICD-10-CM | POA: Insufficient documentation

## 2013-04-24 DIAGNOSIS — M109 Gout, unspecified: Secondary | ICD-10-CM | POA: Insufficient documentation

## 2013-04-24 MED ORDER — COLCHICINE 0.6 MG PO TABS
1.2000 mg | ORAL_TABLET | Freq: Once | ORAL | Status: AC
  Administered 2013-04-24: 1.2 mg via ORAL
  Filled 2013-04-24: qty 2

## 2013-04-24 MED ORDER — PROBENECID 500 MG PO TABS: 1000.0000 mg | ORAL_TABLET | Freq: Two times a day (BID) | ORAL | Status: DC

## 2013-04-24 MED ORDER — OXYCODONE HCL ER 10 MG PO T12A: 10.0000 mg | EXTENDED_RELEASE_TABLET | Freq: Two times a day (BID) | ORAL | Status: DC

## 2013-04-24 MED ORDER — OXYCODONE HCL 5 MG PO TABS
10.0000 mg | ORAL_TABLET | Freq: Once | ORAL | Status: AC
  Administered 2013-04-24: 10 mg via ORAL
  Filled 2013-04-24: qty 2

## 2013-04-24 NOTE — ED Provider Notes (Signed)
Medical screening examination/treatment/procedure(s) were performed by non-physician practitioner and as supervising physician I was immediately available for consultation/collaboration.  EKG Interpretation   None         Lyanne Co, MD 04/24/13 1606

## 2013-04-24 NOTE — Progress Notes (Signed)
[facility] provided pt with a list of primary care resources. Patient stated that he was pending insurance through job.  °

## 2013-04-24 NOTE — ED Provider Notes (Signed)
CSN: [id number]     Arrival date & time 04/24/13  1349 History  This chart was scribed for Emilia Beck, PA working with Lyanne Co, MD by Quintella Reichert, ED Scribe. This patient was seen in room WA08/WA08 and the patient's care was started at 3:27 PM.   Chief Complaint  Patient presents with  . Foot Pain    The history is provided by the patient. No language interpreter was used.    HPI Comments: Ronald Carter is a 47 y.o. male with h/o gout who presents to the Emergency Department complaining of several days of gradually-worsening severe right foot pain with associated right lower extremity swelling.  Pt localizes pain to the plantar surface of the proximal 5th metatarsal radiating out to the entire sole of his foot.  He describes it as aching, sharp and throbbing.  It is worsened by bearing weight.  He denies injury before onset of pain.  He states he had a mild possible gout attack 1-2 years ago and was diagnosed based on blood tests as being "susceptible to gout."  Since then he has had intermittent pain in that foot and takes Uloric as needed which has resolved those past episodes.  However he ran out of this medication.  He states his gout is usually localized to his 2nd and 3rd toe joints whereas his current pain is localized to the entire sole of his foot.  Pt also states that his right calf and foot have been swollen.  In addition he notes that the calf is  "tender but not painful" and states he has some calf cramping pain when walking.  He notes that he rides an hour to and from work every day.  He denies other recent travel or immobilization.  Pt has no PCP   History reviewed. No pertinent past medical history.   Past Surgical History  Procedure Laterality Date  . Back surgery    . Knee surgery      No family history on file.   History  Substance Use Topics  . Smoking status: Current Every Day Smoker -- 0.50 packs/day    Types: Cigarettes  . Smokeless tobacco:  Not on file  . Alcohol Use: 7.2 oz/week    12 Cans of beer per week     Comment: on weekends     Review of Systems  All other systems reviewed and are negative.     Allergies  Review of patient's allergies indicates no known allergies.  Home Medications   Current Outpatient Rx  Name  Route  Sig  Dispense  Refill  . acetaminophen (TYLENOL) 500 MG tablet   Oral   Take 500 mg by mouth every 6 (six) hours as needed.         . diphenhydrAMINE (BENADRYL) 25 mg capsule   Oral   Take 25 mg by mouth every 6 (six) hours as needed for itching.         Marland Kitchen ibuprofen (ADVIL,MOTRIN) 200 MG tablet   Oral   Take 200 mg by mouth every 6 (six) hours as needed for pain.         Marland Kitchen oxyCODONE-acetaminophen (PERCOCET) 5-325 MG per tablet   Oral   Take 1 tablet by mouth every 6 (six) hours as needed for pain.   20 tablet   0   . pseudoephedrine (SUDAFED) 30 MG tablet   Oral   Take 30 mg by mouth every 4 (four) hours as needed for congestion.  BP 132/81  Pulse 91  Temp(Src) 97.6 F (36.4 C) (Oral)  Resp 16  SpO2 96%  Physical Exam  Nursing note and vitals reviewed. Constitutional: He is oriented to person, place, and time. He appears well-developed and well-nourished. No distress.  HENT:  Head: Normocephalic and atraumatic.  Eyes: EOM are normal.  Neck: Neck supple. No tracheal deviation present.  Cardiovascular: Normal rate and intact distal pulses.   Pulmonary/Chest: Effort normal. No respiratory distress.  Musculoskeletal: Normal range of motion.  Right lower leg edema. No right popliteal or calf tenderness to palpation. Tenderness to palpation of plantar aspect of proximal right 5th metatarsal bone. No obvious deformity. Warm to touch.   Neurological: He is alert and oriented to person, place, and time.  Skin: Skin is warm and dry.  Psychiatric: He has a normal mood and affect. His behavior is normal.    ED Course  Procedures (including critical care  time)  DIAGNOSTIC STUDIES: Oxygen Saturation is 96% on room air, normal by my interpretation.    COORDINATION OF CARE: 3:40 PM-Discussed treatment plan which includes pain medication and colchicine with pt at bedside and pt agreed to plan.    Labs Review Labs Reviewed - No data to display   Imaging Review Dg Foot Complete Right  04/24/2013   CLINICAL DATA:  Right foot pain, gout  EXAM: RIGHT FOOT COMPLETE - 3+ VIEW  COMPARISON:  None.  FINDINGS: Normal alignment without fracture. Preserved joint spaces. No significant arthropathy or erosive process. Degenerative changes of the ankle.  IMPRESSION: No acute osseous finding   Electronically Signed   By: Ruel Favors M.D.   On: 04/24/2013 15:16    EKG Interpretation   None       MDM   1. Gout flare     3:58 PM Xray unremarkable for acute changes. Patient has a history of gout flares and states this feels similar. Patient denies injury. The area is tender to palpation and warm. I will treat the patient for gout and instruct him to follow up with a PCP on the resource guide. Patient has right leg swelling likely due to walking compensation due to pain. There is no calf tenderness and patient has no risk factors for DVT. Intact distal pulses.     I personally performed the services described in this documentation, which was scribed in my presence. The recorded information has been reviewed and is accurate.    Emilia Beck, [place] 04/24/13 [phone]

## 2013-04-24 NOTE — ED Notes (Signed)
Per pt gradual worsening of right foot pain. Pt reports hx of gout in that foot and mediation no touching the pain. Pt reports 400 mg of tylenol at 0500 and 600 mg of tylenol at 1000 today.

## 2014-02-02 ENCOUNTER — Encounter (HOSPITAL_COMMUNITY): Payer: Self-pay | Admitting: Emergency Medicine

## 2014-02-02 ENCOUNTER — Emergency Department (HOSPITAL_COMMUNITY): Payer: BC Managed Care – PPO

## 2014-02-02 ENCOUNTER — Emergency Department (HOSPITAL_COMMUNITY)
Admission: EM | Admit: 2014-02-02 | Discharge: 2014-02-02 | Disposition: A | Discharge status: Home / Self Care | Payer: BC Managed Care – PPO | Attending: Emergency Medicine | Admitting: Emergency Medicine

## 2014-02-02 DIAGNOSIS — Z9889 Other specified postprocedural states: Secondary | ICD-10-CM | POA: Insufficient documentation

## 2014-02-02 DIAGNOSIS — M25469 Effusion, unspecified knee: Secondary | ICD-10-CM | POA: Diagnosis not present

## 2014-02-02 DIAGNOSIS — M170 Bilateral primary osteoarthritis of knee: Secondary | ICD-10-CM

## 2014-02-02 DIAGNOSIS — M1710 Unilateral primary osteoarthritis, unspecified knee: Secondary | ICD-10-CM | POA: Insufficient documentation

## 2014-02-02 DIAGNOSIS — IMO0002 Reserved for concepts with insufficient information to code with codable children: Secondary | ICD-10-CM | POA: Diagnosis not present

## 2014-02-02 DIAGNOSIS — M25569 Pain in unspecified knee: Secondary | ICD-10-CM | POA: Diagnosis present

## 2014-02-02 DIAGNOSIS — Z79899 Other long term (current) drug therapy: Secondary | ICD-10-CM | POA: Insufficient documentation

## 2014-02-02 DIAGNOSIS — F17200 Nicotine dependence, unspecified, uncomplicated: Secondary | ICD-10-CM | POA: Diagnosis not present

## 2014-02-02 DIAGNOSIS — M25461 Effusion, right knee: Secondary | ICD-10-CM

## 2014-02-02 MED ORDER — TRAMADOL HCL 50 MG PO TABS
100.0000 mg | ORAL_TABLET | Freq: Once | ORAL | Status: DC
Start: 2014-02-02 — End: 2014-02-02
  Filled 2014-02-02: qty 2

## 2014-02-02 MED ORDER — IBUPROFEN 800 MG PO TABS: 800.0000 mg | ORAL_TABLET | Freq: Three times a day (TID) | ORAL | Status: DC

## 2014-02-02 MED ORDER — TRAMADOL HCL 50 MG PO TABS: 100.0000 mg | ORAL_TABLET | Freq: Four times a day (QID) | ORAL | Status: DC | PRN

## 2014-02-02 MED ORDER — KETOROLAC TROMETHAMINE 60 MG/2ML IM SOLN
60.0000 mg | Freq: Once | INTRAMUSCULAR | Status: AC
  Administered 2014-02-02: 60 mg via INTRAMUSCULAR
  Filled 2014-02-02: qty 2

## 2014-02-02 NOTE — ED Notes (Signed)
Pt. returned from XR. 

## 2014-02-02 NOTE — ED Notes (Signed)
Patient transported to X-ray 

## 2014-02-02 NOTE — ED Provider Notes (Signed)
CSN: [id number]     Arrival date & time 02/02/14  0857 History   First MD Initiated Contact with Patient 02/02/14 [phone]     Chief Complaint  Patient presents with  . Knee Pain     (Consider location/radiation/quality/duration/timing/severity/associated sxs/prior Treatment) HPI Patient has a one-day history of right knee pain and swelling. He does not have any specific injury that has occurred. The patient does however have significant problems with arthritis of the knees he reports his left knee is typically his bad knee. He reports he has had similar pain and swelling in that knee when he had a bone chip behind the knee cap. He reports in that knee he has very bad cartilage and he has bone on bone. He has been exercising more however he reports he has not been doing significant exercising on the treadmill and trying to avoid exercises that would have strained or created additional work on his knees. He notes swelling of the left knee down at the inferior aspect of the patella. He reports is very painful to pressure or movement. He also reports is painful to weightbearing. He does not have any pain behind the knee in the calf the lower leg or the foot.  History reviewed. No pertinent past medical history. Past Surgical History  Procedure Laterality Date  . Back surgery    . Knee surgery     History reviewed. No pertinent family history. History  Substance Use Topics  . Smoking status: Current Every Day Smoker -- 0.50 packs/day    Types: Cigarettes  . Smokeless tobacco: Not on file  . Alcohol Use: 7.2 oz/week    12 Cans of beer per week     Comment: on weekends    this is a physical examination that was placed and review of systems Review of Systems  Constitutional: Negative.        The patient is well appearing. He is expressed that he is expressing moderate to severe pain. He is alert and nontoxic. No respiratory distress  Musculoskeletal:       The right knee has a small amount of  effusion at the inferior lateral aspect of the patella. Is no erythema. he is very tender over the area the effusion and the patella to direct compression. This area of effusion is fairly local and it may also be patellar tendon inflammation. It is however somewhat more to the right lateral aspect. There is no tenderness in the popliteal fossa. There is no gross general effusion within the knee joint. There is no erythema. Range of motion is intact albeit painful for the patient. There is normal inspection of the lower leg with no calf tenderness no edema normal distal pulses warm and dry skin.      Allergies  Review of patient's allergies indicates no known allergies.  Home Medications   Prior to Admission medications   Medication Sig Start Date End Date Taking? Authorizing Provider  acetaminophen (TYLENOL) 500 MG tablet Take 500 mg by mouth every 6 (six) hours as needed.    Historical Provider, MD  diphenhydrAMINE (BENADRYL) 25 mg capsule Take 25 mg by mouth every 6 (six) hours as needed for itching.    Historical Provider, MD  ibuprofen (ADVIL,MOTRIN) 200 MG tablet Take 200 mg by mouth every 6 (six) hours as needed for pain.    Historical Provider, MD  OxyCODONE (OXYCONTIN) 10 mg T12A 12 hr tablet Take 1 tablet (10 mg total) by mouth every 12 (twelve) hours. 04/24/13  Kaitlyn Szekalski, PA-C  oxyCODONE-acetaminophen (PERCOCET) 5-325 MG per tablet Take 1 tablet by mouth every 6 (six) hours as needed for pain. 01/20/13   Purvis Sheffield, MD  probenecid (BENEMID) 500 MG tablet Take 2 tablets (1,000 mg total) by mouth 2 (two) times daily. 04/24/13   Kaitlyn Szekalski, PA-C  pseudoephedrine (SUDAFED) 30 MG tablet Take 30 mg by mouth every 4 (four) hours as needed for congestion.    Historical Provider, MD   There were no vitals taken for this visit. This is the ROS Physical Exam Constitutional the patient has not had fevers or chills Respiratory the patient has not had shortness of breath  chest pain or coughing.   ED Course  Procedures (including critical care time) Labs Review Labs Reviewed - No data to display  Imaging Review No results found.   EKG Interpretation None      MDM   Final diagnoses:  Effusion of knee, right  Osteoarthritis of both knees, unspecified osteoarthritis type   this gentleman presents with mild knee effusion or possibly patellar tendinitis. He does have a history of chronic and degenerative joint disease of the knees. At this point time he'll be treated conservatively with anti-inflammatories icing and tramadol for pain control. The patient is advised to follow up with his orthopedic doctor whom he has already seen for further treatment and management. At this time there is no indication of a septic joint or significant traumatic injury.    Arby Barrette, MD 02/02/14 1011

## 2014-02-02 NOTE — ED Notes (Signed)
Pt reports right knee pain since early this morning. Pt woke up and reported that he couldn't walk.  Pt denies any injury and does not know what the source of the cause is. Pt has swelling and warmth to right lateral aspect of knee.  Pt is able to move all extremities and distal pulses are intact.  Pt reports chills, but denies fever, n/v/d.

## 2014-02-02 NOTE — Discharge Instructions (Signed)

## 2014-02-02 NOTE — ED Notes (Addendum)
Pt c/o right knee pain and swelling, started overnight, denies injury

## 2014-05-07 IMAGING — US US ART/VEN ABD/PELV/SCROTUM DOPPLER LTD
1 series · 13 of 25 positions shown · non-contrast
Comparison: None.

CLINICAL DATA: Left testicular pain.

SCROTAL ULTRASOUND
DOPPLER ULTRASOUND OF THE TESTICLES
TECHNIQUE: Complete ultrasound examination of the testicles,
epididymis, and other scrotal structures was performed.  Color and
spectral Doppler ultrasound were also utilized to evaluate blood
flow to the testicles.

[Series 1: us art/ven abd/pelv/scrotum doppler ltd · 0.08mm/px · 13 of 107 slices shown]
[im 1/107]
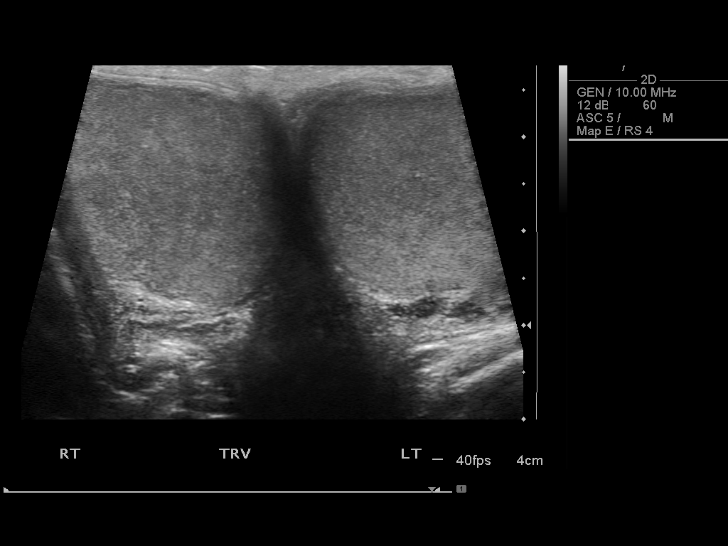
[im 9/107]
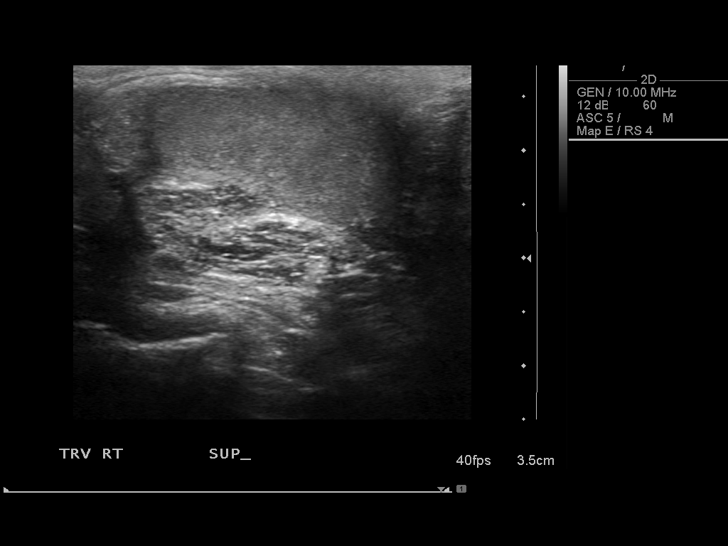
[im 18/107]
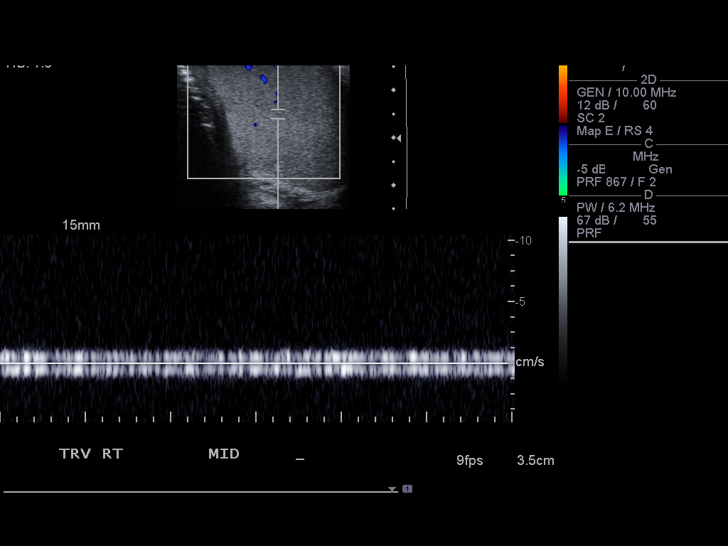
[im 27/107]
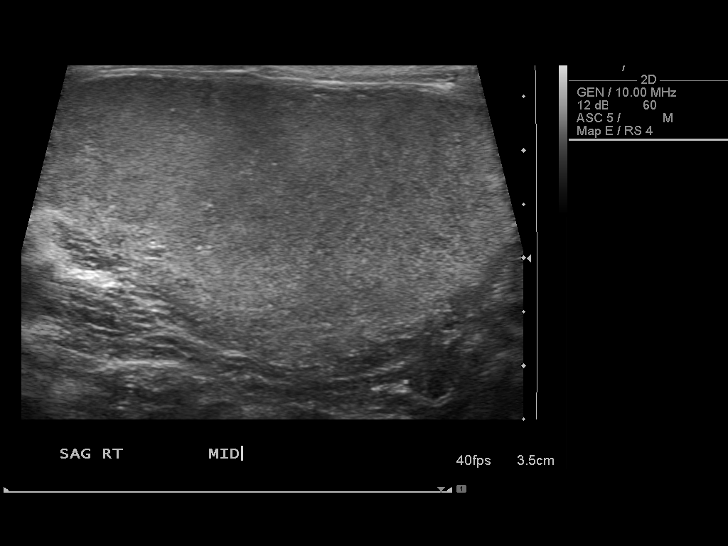
[im 36/107]
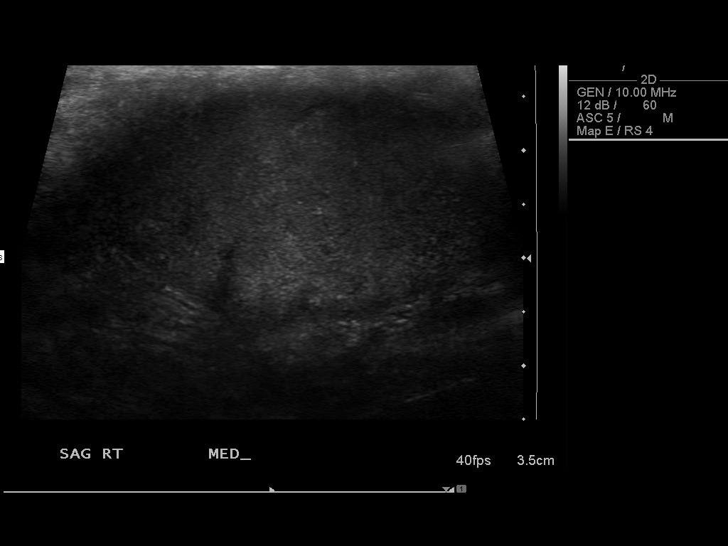
[im 45/107]
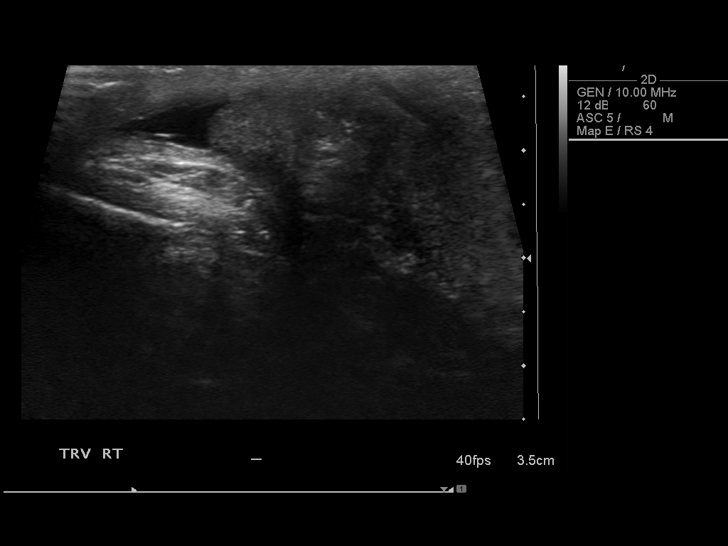
[im 54/107]
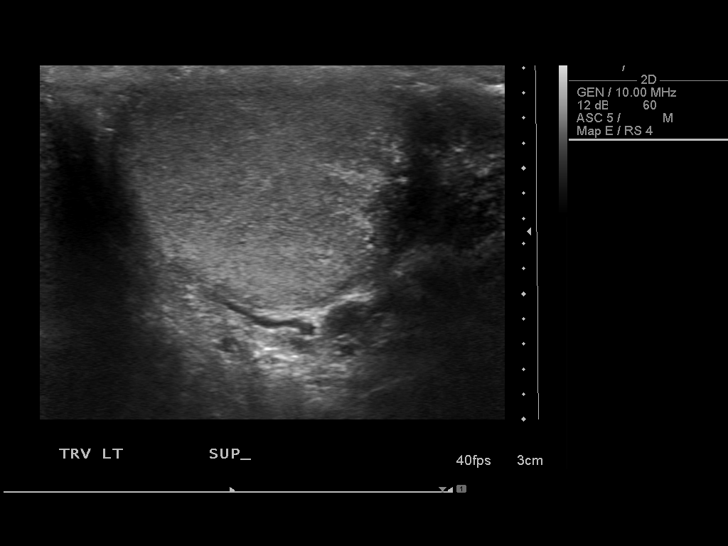
[im 62/107]
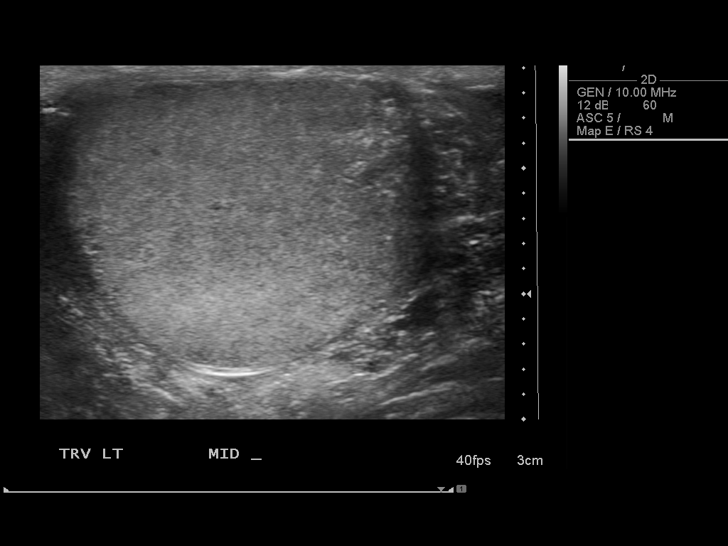
[im 71/107]
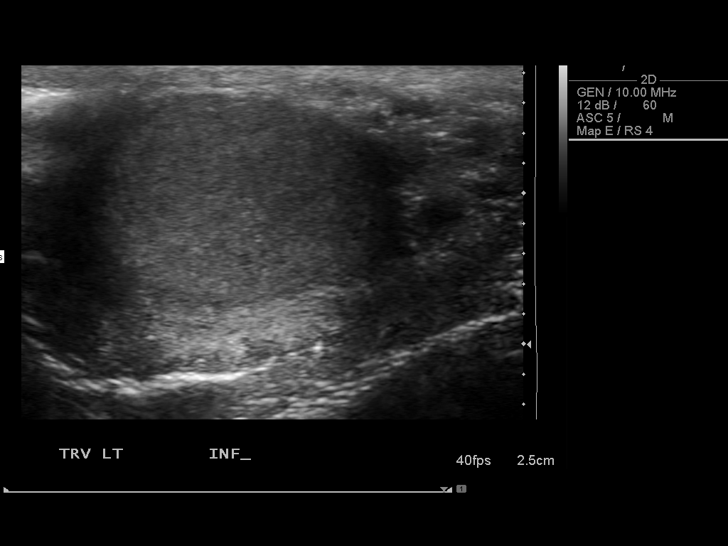
[im 80/107]
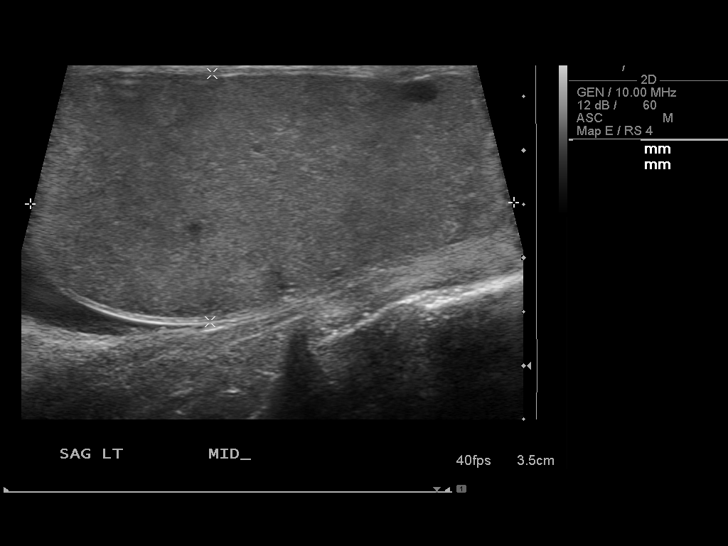
[im 89/107]
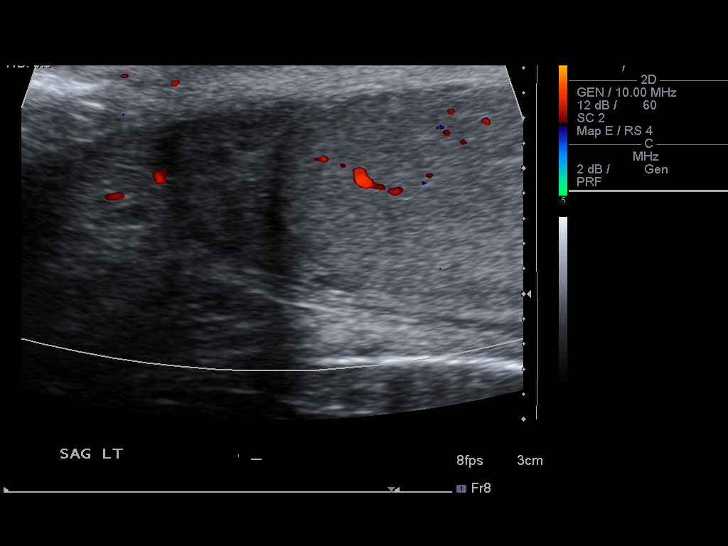
[im 98/107]
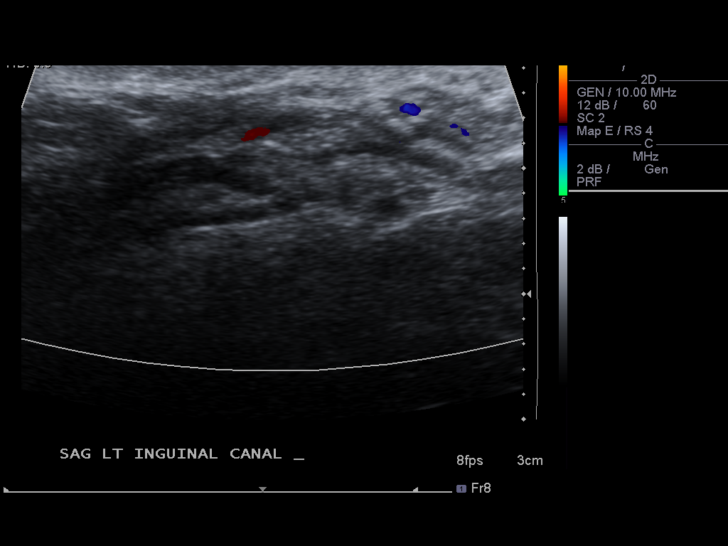
[im 107/107]
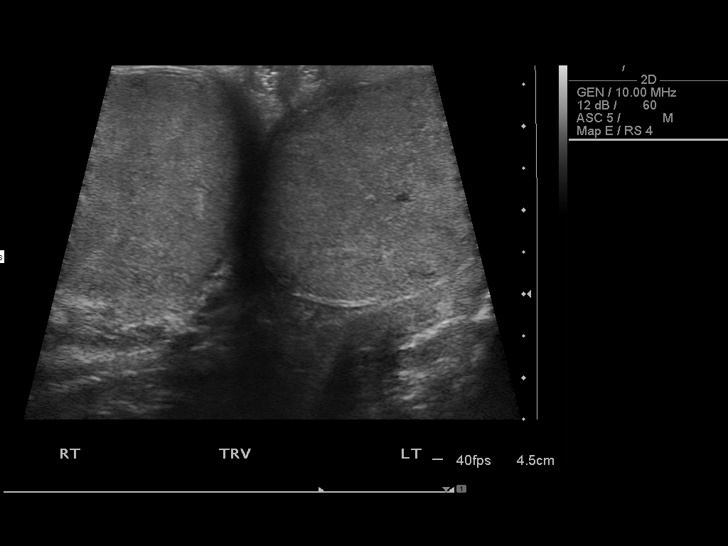

[13 of 25 positions shown; findings below may reference images not displayed]

FINDINGS: Right testis:  Right testis measures 4.6 x 2.2 x 2.8 cm.  Normal
testicular parenchymal echotexture.  No focal mass lesions
demonstrated.  Normal homogeneous flow distribution on color flow
Doppler imaging.

Left testis:  Left testis measures 4.5 x 2.3 x 2.8 cm.  A small
circumscribed low attenuation lesion is demonstrated in the
anterior mid pole of the testis measuring 3.5 x 2.1 x 3.6 mm.  No
flow is demonstrated in this lesion.  Appearance is consistent with
a simple cyst.  No solid mass lesions demonstrated in the left
testis.  Parenchymal echotexture is otherwise homogeneous.  Normal
homogeneous flow distribution on color flow Doppler imaging.

Right epididymis:  Normal in size and appearance.

Left epididymis:  Normal in size and appearance.

Hydrocele:  No scrotal hydroceles demonstrated.

Varicocele:  No scrotal varicoceles demonstrated.

Pulsed Doppler interrogation of both testes demonstrates low
resistance flow bilaterally.
IMPRESSION: Small simple appearing cyst in the left testis, likely benign.
Testicular parenchymal echotexture is otherwise homogeneous and
normal bilaterally.  No evidence of torsion or epididymo-orchitis.

## 2015-06-20 ENCOUNTER — Ambulatory Visit: Payer: BLUE CROSS/BLUE SHIELD | Attending: Orthopaedic Surgery

## 2015-06-20 DIAGNOSIS — M545 Low back pain: Secondary | ICD-10-CM | POA: Insufficient documentation

## 2015-06-20 DIAGNOSIS — M62830 Muscle spasm of back: Secondary | ICD-10-CM | POA: Insufficient documentation

## 2015-06-20 DIAGNOSIS — R531 Weakness: Secondary | ICD-10-CM | POA: Insufficient documentation

## 2015-06-20 DIAGNOSIS — S338XXA Sprain of other parts of lumbar spine and pelvis, initial encounter: Secondary | ICD-10-CM | POA: Insufficient documentation

## 2015-06-27 ENCOUNTER — Ambulatory Visit: Payer: BLUE CROSS/BLUE SHIELD

## 2015-06-27 DIAGNOSIS — S338XXA Sprain of other parts of lumbar spine and pelvis, initial encounter: Secondary | ICD-10-CM | POA: Diagnosis not present

## 2015-06-27 DIAGNOSIS — M62830 Muscle spasm of back: Secondary | ICD-10-CM

## 2015-06-27 DIAGNOSIS — M545 Low back pain, unspecified: Secondary | ICD-10-CM

## 2015-06-27 DIAGNOSIS — R531 Weakness: Secondary | ICD-10-CM | POA: Diagnosis present
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
## 2015-07-02 ENCOUNTER — Ambulatory Visit: Payer: BLUE CROSS/BLUE SHIELD | Admitting: Physical Therapy

## 2015-07-02 DIAGNOSIS — R531 Weakness: Secondary | ICD-10-CM

## 2015-07-02 DIAGNOSIS — M545 Low back pain, unspecified: Secondary | ICD-10-CM

## 2015-07-02 DIAGNOSIS — M62830 Muscle spasm of back: Secondary | ICD-10-CM

## 2015-07-02 DIAGNOSIS — S338XXA Sprain of other parts of lumbar spine and pelvis, initial encounter: Secondary | ICD-10-CM | POA: Diagnosis not present

## 2015-07-02 NOTE — Patient Instructions (Addendum)
Hamstring Stretch: Active    Support behind right knee. Starting with knee bent, attempt to straighten knee until a comfortable stretch is felt in back of thigh. Hold 30__ seconds. Repeat __3_ times per set. Do ___1 sets per session. Do ____1Knee to Chest    Lying supine, bend involved knee to chest 5__ times. Repeat with other leg. Do __1-2imes per day.  Hold 5 seconds.  May stretch knee toward opposite shoulder.  Keep low back flat for stretches. Pelvic Tilt    Flatten back by tightening stomach muscles and buttocks. Repeat 5___ times per set. Do ____ sets per session. Do _1  __ sessions per day. 1 http://orth.exer.us/134   Copyright  VHI. All rights reserved.   Bridge    Lie back, legs bent. Inhale, pressing hips up. Keeping ribs in, lengthen lower back. Exhale, rolling down along spine from top. Repeat _10___ times. Do __1__ sessions per day.  http://pm.exer.us/54   Copyright  VHI. All rights reserved.   http://orth.exer.us/158   Copyright  VHI. All rights reserved.
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
## 2015-07-04 ENCOUNTER — Ambulatory Visit: Payer: BLUE CROSS/BLUE SHIELD

## 2015-07-09 ENCOUNTER — Ambulatory Visit: Payer: BLUE CROSS/BLUE SHIELD | Attending: Orthopaedic Surgery

## 2015-07-09 DIAGNOSIS — R531 Weakness: Secondary | ICD-10-CM | POA: Diagnosis present

## 2015-07-09 DIAGNOSIS — M62830 Muscle spasm of back: Secondary | ICD-10-CM | POA: Diagnosis present

## 2015-07-09 DIAGNOSIS — M545 Low back pain, unspecified: Secondary | ICD-10-CM

## 2015-07-09 DIAGNOSIS — S338XXA Sprain of other parts of lumbar spine and pelvis, initial encounter: Secondary | ICD-10-CM
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
## 2015-07-11 ENCOUNTER — Ambulatory Visit: Payer: BLUE CROSS/BLUE SHIELD | Admitting: Physical Therapy

## 2015-07-11 ENCOUNTER — Ambulatory Visit: Payer: BLUE CROSS/BLUE SHIELD

## 2015-07-15 ENCOUNTER — Ambulatory Visit: Payer: BLUE CROSS/BLUE SHIELD

## 2015-07-19 ENCOUNTER — Ambulatory Visit: Payer: BLUE CROSS/BLUE SHIELD | Admitting: Physical Therapy

## 2015-07-23 ENCOUNTER — Telehealth: Payer: Self-pay

## 2015-07-23 ENCOUNTER — Ambulatory Visit: Payer: BLUE CROSS/BLUE SHIELD

## 2015-07-23 NOTE — Telephone Encounter (Signed)
PT called to follow-up with patient after 3 NS appts in a row. PT left a message and requested pt call back to notify office of plan to continue PT or be discharged. Today as last scheduled appt.

## 2015-07-26 ENCOUNTER — Encounter: Payer: BLUE CROSS/BLUE SHIELD | Admitting: Physical Therapy

## 2016-02-07 ENCOUNTER — Other Ambulatory Visit: Payer: Self-pay | Admitting: Sports Medicine

## 2016-02-07 DIAGNOSIS — M545 Low back pain: Secondary | ICD-10-CM

## 2016-02-11 ENCOUNTER — Ambulatory Visit
Admission: RE | Admit: 2016-02-11 | Discharge: 2016-02-11 | Disposition: A | Discharge status: Home / Self Care | Payer: BLUE CROSS/BLUE SHIELD | Source: Ambulatory Visit | Attending: Sports Medicine | Admitting: Sports Medicine

## 2016-02-11 DIAGNOSIS — M545 Low back pain: Secondary | ICD-10-CM

## 2016-02-20 ENCOUNTER — Other Ambulatory Visit: Payer: Self-pay | Admitting: Sports Medicine

## 2016-02-20 DIAGNOSIS — M25561 Pain in right knee: Secondary | ICD-10-CM

## 2017-03-11 ENCOUNTER — Ambulatory Visit (INDEPENDENT_AMBULATORY_CARE_PROVIDER_SITE_OTHER): Payer: Self-pay | Admitting: Orthopedic Surgery

## 2017-03-18 ENCOUNTER — Ambulatory Visit (INDEPENDENT_AMBULATORY_CARE_PROVIDER_SITE_OTHER): Payer: Self-pay | Admitting: Orthopedic Surgery

## 2017-03-24 ENCOUNTER — Ambulatory Visit (INDEPENDENT_AMBULATORY_CARE_PROVIDER_SITE_OTHER): Payer: Self-pay | Admitting: Orthopedic Surgery

## 2017-03-24 ENCOUNTER — Encounter (INDEPENDENT_AMBULATORY_CARE_PROVIDER_SITE_OTHER): Payer: Self-pay | Admitting: Orthopedic Surgery

## 2017-03-24 DIAGNOSIS — M1712 Unilateral primary osteoarthritis, left knee: Secondary | ICD-10-CM

## 2017-03-24 DIAGNOSIS — M1711 Unilateral primary osteoarthritis, right knee: Secondary | ICD-10-CM

## 2017-03-24 MED ORDER — OXYCODONE-ACETAMINOPHEN 5-325 MG PO TABS: ORAL_TABLET | ORAL | 0 refills | Status: DC

## 2017-03-24 NOTE — Progress Notes (Signed)
Office Visit Note   Patient: Ronald Carter           Date of Birth: Dec 11, 1965           MRN: [id number] Visit Date: 03/24/2017 Requested by: No referring provider defined for this encounter. PCP: System, Provider Not In  Subjective: Chief Complaint  Patient presents with  . Left Knee - Pain  . Right Knee - Pain    HPI: Ronald Carter is a 51 year old patient with known severe bilateral knee arthritis.  The knee hurts worse than the right.  He was not able to keep his job at [employer] due to time off work.  He wants to discuss options until he can get insurance so he can have surgery on his knees.  He has been taking a lot of nonsteroidals and has had some heartburn issues.  Describes generally constant pain in both knees with swelling weakness and giving way.  Injections don't help anymore.              ROS: All systems reviewed are negative as they relate to the chief complaint within the history of present illness.  Patient denies  fevers or chills.   Assessment & Plan: Visit Diagnoses:  1. Unilateral primary osteoarthritis, left knee   2. Unilateral primary osteoarthritis, right knee     Plan: Impression is bilateral knee arthritis.  Plan is weight loss because he has gained about 30 pounds since I have last seen him.  We will try him on to axis with samples provided in order to diminish some of his pain.  One time prescription written with no refills for Percocet for pain to be used on an as-needed basis.  Again no refills on this medication.  Follow-up with me as needed when he wants to get his knees replaced short of that we have nothing really to offer him.  Follow-Up Instructions: Return if symptoms worsen or fail to improve.   Orders:  No orders of the defined types were placed in this encounter.  Meds ordered this encounter  Medications  . oxyCODONE-acetaminophen (PERCOCET/ROXICET) 5-325 MG tablet    Sig: 1 po q d prn pain    Dispense:  25 tablet    Refill:   0      Procedures: No procedures performed   Clinical Data: No additional findings.  Objective: Vital Signs: There were no vitals taken for this visit.  Physical Exam:   Constitutional: Patient appears well-developed HEENT:  Head: Normocephalic Eyes:EOM are normal Neck: Normal range of motion Cardiovascular: Normal rate Pulmonary/chest: Effort normal Neurologic: Patient is alert Skin: Skin is warm Psychiatric: Patient has normal mood and affect    Ortho Exam: Orthopedic exam demonstrates varus alignment with no effusion in either knee.  Pedal pulses palpable.  No groin pain with internal/external rotation of the leg.  Collateral crucial ligaments are stable in both knees.  No other masses lymph adenopathy or skin changes noted in the knee region on either side.  Range of motion is good with flexion past 90 and near full extension in both knees  Specialty Comments:  No specialty comments available.  Imaging: No results found.   PMFS History: There are no active problems to display for this patient.  No past medical history on file.  No family history on file.  Past Surgical History:  Procedure Laterality Date  . BACK SURGERY    . KNEE SURGERY     Social History   Occupational  History  . Not on file.   Social History Main Topics  . Smoking status: Current Every Day Smoker    Packs/day: 0.50    Types: Cigarettes  . Smokeless tobacco: Never Used  . Alcohol use 7.2 oz/week    12 Cans of beer per week     Comment: on weekends  . Drug use: No  . Sexual activity: Not on file

## 2017-09-02 ENCOUNTER — Telehealth (INDEPENDENT_AMBULATORY_CARE_PROVIDER_SITE_OTHER): Payer: Self-pay | Admitting: Orthopedic Surgery

## 2017-09-02 MED ORDER — IBUPROFEN-FAMOTIDINE 800-26.6 MG PO TABS: ORAL_TABLET | ORAL | 0 refills | Status: DC

## 2017-09-02 NOTE — Telephone Encounter (Signed)
Ok for this? 

## 2017-09-02 NOTE — Telephone Encounter (Signed)
Ok to send in rx for duexis?

## 2017-09-02 NOTE — Telephone Encounter (Signed)
Patient called said he would like Dr. August Saucerean to send in a prescription for him, he got a sample of it at his OV. It is ibuprofen with pepcid in it. Patient uses [facility] on [address]. He would like you to call his girlfriend if we can do this for him. Harriett Sineancy # [phone]

## 2017-09-02 NOTE — Telephone Encounter (Signed)
IC patient back. No answer.LMVM for him advising that we cannot sent to [facility] b/c insurance will not cover. I sent to [facility] and advised on VM would be contacted to get shipped to him.

## 2017-09-08 ENCOUNTER — Telehealth (INDEPENDENT_AMBULATORY_CARE_PROVIDER_SITE_OTHER): Payer: Self-pay | Admitting: Orthopedic Surgery

## 2017-09-08 NOTE — Telephone Encounter (Signed)
Patient is currently incarcerated so Harriett Sineancy called on his behalf, she was asking for the ibuprofen 800 to be sent into his pharmacy. The other ibuprofen RX that was sent in cost over 2,000 dollars and they can't afford that. CB # [phone]

## 2017-09-08 NOTE — Telephone Encounter (Signed)
Per Dr August Saucerean called and advised that they would need to get any medication needed through the attending provider at the facility where patient is incarcerated.

## 2017-10-29 ENCOUNTER — Telehealth (INDEPENDENT_AMBULATORY_CARE_PROVIDER_SITE_OTHER): Payer: Self-pay | Admitting: Orthopedic Surgery

## 2017-10-29 NOTE — Telephone Encounter (Signed)
Ok to refill 

## 2017-10-29 NOTE — Telephone Encounter (Signed)
Patient's sister called requesting an RX refill on his Ibuprofen.  CB#[phone].  Thank you.

## 2017-10-30 NOTE — Telephone Encounter (Signed)
Y does he qualify for duexis?

## 2017-11-01 ENCOUNTER — Other Ambulatory Visit (INDEPENDENT_AMBULATORY_CARE_PROVIDER_SITE_OTHER): Payer: Self-pay

## 2017-11-01 ENCOUNTER — Telehealth (INDEPENDENT_AMBULATORY_CARE_PROVIDER_SITE_OTHER): Payer: Self-pay | Admitting: Orthopedic Surgery

## 2017-11-01 MED ORDER — IBUPROFEN-FAMOTIDINE 800-26.6 MG PO TABS: ORAL_TABLET | ORAL | 0 refills | Status: DC

## 2017-11-01 NOTE — Telephone Encounter (Signed)
Patient's sister Loel Lofty(Cheryl Parks) called advised her brother does not have insurance and the Rx according to the pharmacy will be over $1000.00 and he can not afford the medication. Elnita Maxwellheryl asked if patient can get a Rx for Oxycodone. The  number to contact patient is [phone]

## 2017-11-01 NOTE — Telephone Encounter (Signed)
IC, after very at length conversation,  advised was unable to speak with her as she was not listed on patients release form. Come to find out patient is actually in jail. Advised any medication needed for any medical concerns is usually addressed by the jail since he is in the care of the jail. She verbalized understanding.

## 2017-11-01 NOTE — Telephone Encounter (Signed)
He does and I submitted duexis for him.

## 2018-01-24 ENCOUNTER — Encounter: Payer: Self-pay | Admitting: Adult Health

## 2018-01-24 ENCOUNTER — Ambulatory Visit (INDEPENDENT_AMBULATORY_CARE_PROVIDER_SITE_OTHER): Payer: Self-pay | Admitting: Adult Health

## 2018-01-24 VITALS — BP 116/75 | HR 89 | Ht 70.0 in | Wt 256.0 lb

## 2018-01-24 DIAGNOSIS — F17200 Nicotine dependence, unspecified, uncomplicated: Secondary | ICD-10-CM

## 2018-01-24 DIAGNOSIS — Z0000 Encounter for general adult medical examination without abnormal findings: Secondary | ICD-10-CM | POA: Insufficient documentation

## 2018-01-24 DIAGNOSIS — F329 Major depressive disorder, single episode, unspecified: Secondary | ICD-10-CM

## 2018-01-24 DIAGNOSIS — F32A Depression, unspecified: Secondary | ICD-10-CM

## 2018-01-24 DIAGNOSIS — F419 Anxiety disorder, unspecified: Secondary | ICD-10-CM | POA: Insufficient documentation

## 2018-01-24 MED ORDER — BUPROPION HCL ER (SMOKING DET) 150 MG PO TB12: 150.0000 mg | ORAL_TABLET | Freq: Two times a day (BID) | ORAL | 0 refills | Status: DC

## 2018-01-24 NOTE — Assessment & Plan Note (Signed)
Continue your excellent water intake. Follow Mediterranean diet Increase regular exercise.  Recommend at least 30 minutes daily, 5 days per week of walking, jogging, biking, swimming, YouTube/Pinterest workout videos. Please start Wellbutrin 150mg  twice daily- you can quit smoking! Recommend fasting labs with complete physical this fall. Schedule fasting lab appt then physical about a week later.
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
## 2018-01-24 NOTE — Assessment & Plan Note (Signed)
Started on Wellbutrin 150mg  BID Declined CBT Denies thoughts of harming himself/others

## 2018-01-24 NOTE — Assessment & Plan Note (Signed)
Please start Wellbutrin 150mg  twice daily- you can quit smoking!

## 2018-01-24 NOTE — Patient Instructions (Addendum)
Mediterranean Diet A Mediterranean diet refers to food and lifestyle choices that are based on the traditions of countries located on the [place]. This way of eating has been shown to help prevent certain conditions and improve outcomes for people who have chronic diseases, like kidney disease and heart disease. What are tips for following this plan? Lifestyle  Cook and eat meals together with your family, when possible.  Drink enough fluid to keep your urine clear or pale yellow.  Be physically active every day. This includes: ? Aerobic exercise like running or swimming. ? Leisure activities like gardening, walking, or housework.  Get 7-8 hours of sleep each night.  If recommended by your health care provider, drink red wine in moderation. This means 1 glass a day for nonpregnant women and 2 glasses a day for men. A glass of wine equals 5 oz (150 mL). Reading food labels  Check the serving size of packaged foods. For foods such as rice and pasta, the serving size refers to the amount of cooked product, not dry.  Check the total fat in packaged foods. Avoid foods that have saturated fat or trans fats.  Check the ingredients list for added sugars, such as corn syrup. Shopping  At the grocery store, buy most of your food from the areas near the walls of the store. This includes: ? Fresh fruits and vegetables (produce). ? Grains, beans, nuts, and seeds. Some of these may be available in unpackaged forms or large amounts (in bulk). ? Fresh seafood. ? Poultry and eggs. ? Low-fat dairy products.  Buy whole ingredients instead of prepackaged foods.  Buy fresh fruits and vegetables in-season from local farmers markets.  Buy frozen fruits and vegetables in resealable bags.  If you do not have access to quality fresh seafood, buy precooked frozen shrimp or canned fish, such as tuna, salmon, or sardines.  Buy small amounts of raw or cooked vegetables, salads, or olives from the  deli or salad bar at your store.  Stock your pantry so you always have certain foods on hand, such as olive oil, canned tuna, canned tomatoes, rice, pasta, and beans. Cooking  Cook foods with extra-virgin olive oil instead of using butter or other vegetable oils.  Have meat as a side dish, and have vegetables or grains as your main dish. This means having meat in small portions or adding small amounts of meat to foods like pasta or stew.  Use beans or vegetables instead of meat in common dishes like chili or lasagna.  Experiment with different cooking methods. Try roasting or broiling vegetables instead of steaming or sauteing them.  Add frozen vegetables to soups, stews, pasta, or rice.  Add nuts or seeds for added healthy fat at each meal. You can add these to yogurt, salads, or vegetable dishes.  Marinate fish or vegetables using olive oil, lemon juice, garlic, and fresh herbs. Meal planning  Plan to eat 1 vegetarian meal one day each week. Try to work up to 2 vegetarian meals, if possible.  Eat seafood 2 or more times a week.  Have healthy snacks readily available, such as: ? Vegetable sticks with hummus. ? Greek yogurt. ? Fruit and nut trail mix.  Eat balanced meals throughout the week. This includes: ? Fruit: 2-3 servings a day ? Vegetables: 4-5 servings a day ? Low-fat dairy: 2 servings a day ? Fish, poultry, or lean meat: 1 serving a day ? Beans and legumes: 2 or more servings a week ? Nuts   and seeds: 1-2 servings a day ? Whole grains: 6-8 servings a day ? Extra-virgin olive oil: 3-4 servings a day  Limit red meat and sweets to only a few servings a month What are my food choices?  Mediterranean diet ? Recommended ? Grains: Whole-grain pasta. Brown rice. Bulgar wheat. Polenta. Couscous. Whole-wheat bread. [place]. ? Vegetables: Artichokes. Beets. Broccoli. Cabbage. Carrots. Eggplant. Green beans. Chard. Kale. Spinach. Onions. Leeks. Peas. Squash.  Tomatoes. Peppers. Radishes. ? Fruits: Apples. Apricots. Avocado. Berries. Bananas. Cherries. Dates. Figs. Grapes. Lemons. Melon. Oranges. Peaches. Plums. Pomegranate. ? Meats and other protein foods: Beans. Almonds. Sunflower seeds. Pine nuts. Peanuts. [place]. Salmon. Scallops. Shrimp. [place]. Tilapia. Clams. Oysters. Eggs. ? Dairy: Low-fat milk. Cheese. Greek yogurt. ? Beverages: Water. Red wine. Herbal tea. ? Fats and oils: Extra virgin olive oil. Avocado oil. Grape seed oil. ? Sweets and desserts: [place] yogurt with honey. Baked apples. Poached pears. Trail mix. ? Seasoning and other foods: Basil. Cilantro. Coriander. Cumin. Mint. Parsley. Sage. Rosemary. Tarragon. Garlic. Oregano. Thyme. Pepper. Balsalmic vinegar. Tahini. Hummus. Tomato sauce. Olives. Mushrooms. ? Limit these ? Grains: Prepackaged pasta or rice dishes. Prepackaged cereal with added sugar. ? Vegetables: Deep fried potatoes (french fries). ? Fruits: Fruit canned in syrup. ? Meats and other protein foods: Beef. Pork. Lamb. Poultry with skin. Hot dogs. [place] Salines. ? Dairy: Ice cream. Sour cream. Whole milk. ? Beverages: Juice. Sugar-sweetened soft drinks. Beer. Liquor and spirits. ? Fats and oils: Butter. Canola oil. Vegetable oil. Beef fat (tallow). Lard. ? Sweets and desserts: Cookies. Cakes. Pies. Candy. ? Seasoning and other foods: Mayonnaise. Premade sauces and marinades. ? The items listed may not be a complete list. Talk with your dietitian about what dietary choices are right for you. Summary  The Mediterranean diet includes both food and lifestyle choices.  Eat a variety of fresh fruits and vegetables, beans, nuts, seeds, and whole grains.  Limit the amount of red meat and sweets that you eat.  Talk with your health care provider about whether it is safe for you to drink red wine in moderation. This means 1 glass a day for nonpregnant women and 2 glasses a day for men. A glass of wine equals 5 oz (150 mL). This information  is not intended to replace advice given to you by your health care provider. Make sure you discuss any questions you have with your health care provider. Document Released: 01/09/2016 Document Revised: 02/11/2016 Document Reviewed: 01/09/2016 Elsevier Interactive Patient Education  2018 [employer].   Tobacco Use Disorder Tobacco use disorder (TUD) is a mental disorder. It is the long-term use of tobacco in spite of related health problems or difficulty with normal life activities. Tobacco is most commonly smoked as cigarettes and less commonly as cigars or pipes. Smokeless chewing tobacco and snuff are also popular. People with TUD get a feeling of extreme pleasure (euphoria) from using tobacco and have a desire to use it again and again. Repeated use of tobacco can cause problems. The addictive effects of tobacco are due mainly tothe ingredient nicotine. Nicotine also causes a rush of adrenaline (epinephrine) in the body. This leads to increased blood pressure, heart rate, and breathing rate. These changes may cause problems for people with high blood pressure, weak hearts, or lung disease. High doses of nicotine in children and pets can lead to seizures and death. Tobacco contains a number of other unsafe chemicals. These chemicals are especially harmful when inhaled as smoke and can damage almost every organ in  the body. Smokers live shorter lives than nonsmokers and are at risk of dying from a number of diseases and cancers. Tobacco smoke can also cause health problems for nonsmokers (due to inhaling secondhand smoke). Smoking is also a fire hazard. TUD usually starts in the late teenage years and is most common in young adults between the ages of 13 and 31 years. People who start smoking earlier in life are more likely to continue smoking as adults. TUD is somewhat more common in men than women. People with TUD are at higher risk for using alcohol and other drugs of abuse. What increases the  risk? Risk factors for TUD include:  Having family members with the disorder.  Being around people who use tobacco.  Having an existing mental health issue such as schizophrenia, depression, bipolar disorder, ADHD, or posttraumatic stress disorder (PTSD).  What are the signs or symptoms? People with tobacco use disorder have two or more of the following signs and symptoms within 12 months:  Use of more tobacco over a longer period than intended.  Not able to cut down or control tobacco use.  A lot of time spent obtaining or using tobacco.  Strong desire or urge to use tobacco (craving). Cravings may last for 6 months or longer after quitting.  Use of tobacco even when use leads to major problems at work, school, or home.  Use of tobacco even when use leads to relationship problems.  Giving up or cutting down on important life activities because of tobacco use.  Repeatedly using tobacco in situations where it puts you or others in physical danger, like smoking in bed.  Use of tobacco even when it is known that a physical or mental problem is likely related to tobacco use. ? Physical problems are numerous and may include chronic bronchitis, emphysema, lung and other cancers, gum disease, high blood pressure, heart disease, and stroke. ? Mental problems caused by tobacco may include difficulty sleeping and anxiety.  Need to use greater amounts of tobacco to get the same effect. This means you have developed a tolerance.  Withdrawal symptoms as a result of stopping or rapidly cutting back use. These symptoms may last a month or more after quitting and include the following: ? Depressed, anxious, or irritable mood. ? Difficulty concentrating. ? Increased appetite. ? Restlessness or trouble sleeping. ? Use of tobacco to avoid withdrawal symptoms.  How is this diagnosed? Tobacco use disorder is diagnosed by your health care provider. A diagnosis may be made by:  Your health care  provider asking questions about your tobacco use and any problems it may be causing.  A physical exam.  Lab tests.  You may be referred to a mental health professional or addiction specialist.  The severity of tobacco use disorder depends on the number of signs and symptoms you have:  Mild-Two or three symptoms.  Moderate-Four or five symptoms.  Severe-Six or more symptoms.  How is this treated? Many people with tobacco use disorder are unable to quit on their own and need help. Treatment options include the following:  Nicotine replacement therapy (NRT). NRT provides nicotine without the other harmful chemicals in tobacco. NRT gradually lowers the dosage of nicotine in the body and reduces withdrawal symptoms. NRT is available in over-the-counter forms (gum, lozenges, and skin patches) as well as prescription forms (mouth inhaler and nasal spray).  Medicines.This may include: ? Antidepressant medicine that may reduce nicotine cravings. ? A medicine that acts on nicotine receptors in the  brain to reduce cravings and withdrawal symptoms. It may also block the effects of tobacco in people with TUD who relapse.  Counseling or talk therapy. A form of talk therapy called behavioral therapy is commonly used to treat people with TUD. Behavioral therapy looks at triggers for tobacco use, how to avoid them, and how to cope with cravings. It is most effective in person or by phone but is also available in self-help forms (books and Internet websites).  Support groups. These provide emotional support, advice, and guidance for quitting tobacco.  The most effective treatment for TUD is usually a combination of medicine, talk therapy, and support groups. Follow these instructions at home:  Keep all follow-up visits as directed by your health care provider. This is important.  Take medicines only as directed by your health care provider.  Check with your health care provider before starting new  prescription or over-the-counter medicines. Contact a health care provider if:  You are not able to take your medicines as prescribed.  Treatment is not helping your TUD and your symptoms get worse. Get help right away if:  You have serious thoughts about hurting yourself or others.  You have trouble breathing, chest pain, sudden weakness, or sudden numbness in part of your body. This information is not intended to replace advice given to you by your health care provider. Make sure you discuss any questions you have with your health care provider. Document Released: 01/22/2004 Document Revised: 01/19/2016 Document Reviewed: 07/14/2013 Elsevier Interactive Patient Education  2018 [employer]  Continue your excellent water intake. Follow Mediterranean diet Increase regular exercise.  Recommend at least 30 minutes daily, 5 days per week of walking, jogging, biking, swimming, YouTube/Pinterest workout videos. Please start Wellbutrin 150mg  twice daily- you can quit smoking! Recommend fasting labs with complete physical this fall. Schedule fasting lab appt then physical about a week later. WELCOME TO THE PRACTICE!

## 2018-04-19 ENCOUNTER — Other Ambulatory Visit: Payer: Self-pay

## 2018-04-26 ENCOUNTER — Encounter: Payer: Self-pay | Admitting: Adult Health

## 2018-04-27 ENCOUNTER — Other Ambulatory Visit: Payer: Self-pay

## 2018-04-27 DIAGNOSIS — Z0000 Encounter for general adult medical examination without abnormal findings: Secondary | ICD-10-CM

## 2018-04-28 LAB — LIPID PANEL
CHOL/HDL RATIO: 3.7 ratio (ref 0.0–5.0)
Cholesterol, Total: 163 mg/dL (ref 100–199)
HDL: 44 mg/dL (ref >39)
LDL CALC: 106 mg/dL — AB (ref 0–99)
Triglycerides: 64 mg/dL (ref 0–149)
VLDL CHOLESTEROL CAL: 13 mg/dL (ref 5–40)

## 2018-04-28 LAB — COMPREHENSIVE METABOLIC PANEL
ALBUMIN: 4.4 g/dL (ref 3.5–5.5)
ALK PHOS: 74 IU/L (ref 39–117)
ALT: 27 IU/L (ref 0–44)
AST: 18 IU/L (ref 0–40)
Albumin/Globulin Ratio: 1.8 (ref 1.2–2.2)
BUN / CREAT RATIO: 19 (ref 9–20)
BUN: 12 mg/dL (ref 6–24)
Bilirubin Total: 0.3 mg/dL (ref 0.0–1.2)
CO2: 21 mmol/L (ref 20–29)
CREATININE: 0.64 mg/dL — AB (ref 0.76–1.27)
Calcium: 9.1 mg/dL (ref 8.7–10.2)
Chloride: 107 mmol/L — ABNORMAL HIGH (ref 96–106)
GFR calc Af Amer: 131 mL/min/{1.73_m2} (ref >59)
GFR calc non Af Amer: 113 mL/min/{1.73_m2} (ref >59)
GLOBULIN, TOTAL: 2.4 g/dL (ref 1.5–4.5)
Glucose: 104 mg/dL — ABNORMAL HIGH (ref 65–99)
Potassium: 4.4 mmol/L (ref 3.5–5.2)
SODIUM: 140 mmol/L (ref 134–144)
Total Protein: 6.8 g/dL (ref 6.0–8.5)

## 2018-04-28 LAB — CBC WITH DIFFERENTIAL/PLATELET
BASOS: 1 %
Basophils Absolute: 0 10*3/uL (ref 0.0–0.2)
EOS (ABSOLUTE): 0.1 10*3/uL (ref 0.0–0.4)
EOS: 3 %
HEMATOCRIT: 45.3 % (ref 37.5–51.0)
Hemoglobin: 15.5 g/dL (ref 13.0–17.7)
Immature Grans (Abs): 0 10*3/uL (ref 0.0–0.1)
Immature Granulocytes: 0 %
LYMPHS ABS: 1.7 10*3/uL (ref 0.7–3.1)
Lymphs: 40 %
MCH: 29.7 pg (ref 26.6–33.0)
MCHC: 34.2 g/dL (ref 31.5–35.7)
MCV: 87 fL (ref 79–97)
MONOS ABS: 0.3 10*3/uL (ref 0.1–0.9)
Monocytes: 8 %
Neutrophils Absolute: 2 10*3/uL (ref 1.4–7.0)
Neutrophils: 48 %
Platelets: 251 10*3/uL (ref 150–450)
RBC: 5.22 x10E6/uL (ref 4.14–5.80)
RDW: 13.2 % (ref 12.3–15.4)
WBC: 4.2 10*3/uL (ref 3.4–10.8)

## 2018-04-28 LAB — TSH: TSH: 1.2 u[IU]/mL (ref 0.450–4.500)

## 2018-04-28 LAB — HEMOGLOBIN A1C
Est. average glucose Bld gHb Est-mCnc: 117 mg/dL
Hgb A1c MFr Bld: 5.7 % — ABNORMAL HIGH (ref 4.8–5.6)

## 2018-04-29 NOTE — Progress Notes (Signed)
Subjective:    Patient ID: Ronald Carter, male    DOB: [birth year], 52 y.o.   MRN: [id number]  HPI :  Ronald Carter is here for CPE He continues to use tobacco, however is is tapering off and estimates to smoke pack every 2/3 days He estimates to drink >70 oz water/day and has been trying to reduce CHO/sugar in diet He denies formal exercise outside of work and is concerned about his abdominal obesity of resulting pain/pressure on his knees. He reports urinary changes that last year, specifically difficulty getting/maintating stream and dribbling. He denies hematuria and family hx of prostate ca He denies previously elevated PSA or abnormal Prostate exam  Reviewed recent labs- A1c- 5.7 LDL -106  Healthcare Maintenance: Colonoscopy-declined due to lack of insurance  Immunizations-declined due to lack of health insurance   Patient Care Team    Relationship Specialty Notifications Start End  [place], Orpha BurKaty D, NP PCP - General Family Medicine  01/24/18   Cammy Copaean, Gregory Scott, MD Consulting Physician Orthopedic Surgery  01/24/18     Patient Active Problem List   Diagnosis Date Noted  . Urinary hesitancy 05/02/2018  . Tobacco use disorder 01/24/2018  . Healthcare maintenance 01/24/2018  . Anxiety 01/24/2018  . Depression 01/24/2018     Past Medical History:  Diagnosis Date  . Anxiety   . Chronic back pain   . Chronic knee pain    left  . Depression   . GERD (gastroesophageal reflux disease)      Past Surgical History:  Procedure Laterality Date  . BACK SURGERY    . KNEE SURGERY       Family History  Problem Relation Age of Onset  . COPD Mother   . Diabetes Father   . Heart disease Father   . Heart attack Father   . Fibromyalgia Sister   . Hypertension Brother   . Healthy Sister   . Healthy Sister   . Hypertension Brother   . Hypertension Brother   . Hypertension Brother   . Hypertension Brother      Social History   Substance and Sexual Activity  Drug  Use No     Social History   Substance and Sexual Activity  Alcohol Use Not Currently     Social History   Tobacco Use  Smoking Status Current Every Day Smoker  . Packs/day: 0.50  . Years: 25.00  . Pack years: 12.50  . Types: Cigarettes  Smokeless Tobacco Never Used     Outpatient Encounter Medications as of 05/02/2018  Medication Sig  . Aspirin-Acetaminophen (GOODYS BODY PAIN PO) Take 1 packet by mouth once as needed (pain).  Marland Kitchen. ibuprofen (ADVIL,MOTRIN) 800 MG tablet Take 1 tablet (800 mg total) by mouth 3 (three) times daily.  . [DISCONTINUED] buPROPion (ZYBAN) 150 MG 12 hr tablet Take 1 tablet (150 mg total) by mouth 2 (two) times daily.  . [DISCONTINUED] oxyCODONE-acetaminophen (PERCOCET/ROXICET) 5-325 MG tablet 1 po q d prn pain   No facility-administered encounter medications on file as of 05/02/2018.     Allergies: Patient has no known allergies.  Body mass index is 37.52 kg/m.  Blood pressure 116/68, pulse 74, temperature 98.1 F (36.7 C), temperature source Oral, height 5' 9.5" (1.765 m), weight 257 lb 12.8 oz (116.9 kg), SpO2 98 %.    Review of Systems  Constitutional: Positive for fatigue. Negative for activity change, appetite change, chills, diaphoresis, fever and unexpected weight change.  HENT: Negative for congestion.   Eyes: Negative  for visual disturbance.  Respiratory: Negative for cough, chest tightness, shortness of breath and wheezing.   Cardiovascular: Negative for chest pain, palpitations and leg swelling.  Gastrointestinal: Negative for abdominal distention, anal bleeding, blood in stool, constipation, diarrhea, nausea and rectal pain.       Protuberant abdomen   Endocrine: Negative for cold intolerance, heat intolerance, polydipsia, polyphagia and polyuria.  Genitourinary: Positive for difficulty urinating, frequency and urgency. Negative for flank pain and hematuria.  Musculoskeletal: Positive for arthralgias, back pain, joint swelling and  myalgias.  Skin: Negative for color change, pallor, rash and wound.  Neurological: Negative for dizziness and headaches.  Hematological: Does not bruise/bleed easily.  Psychiatric/Behavioral: Negative for agitation, behavioral problems, confusion, decreased concentration, dysphoric mood, hallucinations, self-injury, sleep disturbance and suicidal ideas. The patient is not nervous/anxious and is not hyperactive.        Objective:   Physical Exam  Constitutional: He is oriented to person, place, and time. He appears well-developed and well-nourished. No distress.  HENT:  Head: Normocephalic and atraumatic.  Right Ear: External ear normal. Tympanic membrane is not erythematous and not bulging. No decreased hearing is noted.  Left Ear: External ear normal. Tympanic membrane is not erythematous and not bulging. No decreased hearing is noted.  Nose: Nose normal. No mucosal edema or rhinorrhea. Right sinus exhibits no maxillary sinus tenderness and no frontal sinus tenderness. Left sinus exhibits no maxillary sinus tenderness and no frontal sinus tenderness.  Mouth/Throat: Oropharynx is clear and moist and mucous membranes are normal. Abnormal dentition. No posterior oropharyngeal edema or posterior oropharyngeal erythema. Tonsils are 0 on the right. Tonsils are 0 on the left.  Eyes: Pupils are equal, round, and reactive to light. Conjunctivae and EOM are normal.  Neck: Normal range of motion. Neck supple.  Cardiovascular: Normal rate, regular rhythm, normal heart sounds and intact distal pulses.  No murmur heard. Pulmonary/Chest: Effort normal and breath sounds normal. No stridor. No respiratory distress. He has no wheezes. He has no rales.  Abdominal: Soft. Bowel sounds are normal. He exhibits no distension and no mass. There is no tenderness. There is no rebound, no guarding and no CVA tenderness. No hernia.  Protuberant abdomen   Genitourinary: Rectal exam shows guaiac negative stool.   Genitourinary Comments: Chaperone present during examination. Unable to palpable prostate due to body habitus   Musculoskeletal: He exhibits edema and tenderness.       Right knee: He exhibits swelling.       Left knee: He exhibits swelling. Tenderness found.  Lymphadenopathy:    He has no cervical adenopathy.  Neurological: He is alert and oriented to person, place, and time. Coordination normal.  Skin: Skin is warm and dry. Capillary refill takes less than 2 seconds. No rash noted. He is not diaphoretic. No erythema. No pallor.  Psychiatric: He has a normal mood and affect. His behavior is normal. Judgment and thought content normal.  Nursing note and vitals reviewed.         Assessment & Plan:   1. Urinary frequency   2. Urinary hesitancy   3. Urinary incontinence, unspecified type   4. Healthcare maintenance   5. Tobacco use disorder     Healthcare maintenance  Continue to drink plenty of water and follow Mediterranean diet. Increase regular exercise, recommend swimming, stationary biking. Continue to reduce-stop tobacco use. Referral to Urologist placed, re: change in urinary habits and unable to completely palpate (feel) prostate during examination today. Recommend annual follow-up- physical with fasting labs.  Tobacco use disorder Declined smoking cessation  Currently smoking pack every 2-3 days  Urinary hesitancy Referral to Urologist placed, re: change in urinary habits and unable to completely palpate (feel) prostate during examination today. Change in urinary habits the last year He denies 1st degree family hx of Prostate ca    FOLLOW-UP:  Return in about 1 year (around 05/03/2019) for CPE, Fasting Labs.

## 2018-05-02 ENCOUNTER — Ambulatory Visit (INDEPENDENT_AMBULATORY_CARE_PROVIDER_SITE_OTHER): Payer: Self-pay | Admitting: Adult Health

## 2018-05-02 ENCOUNTER — Encounter: Payer: Self-pay | Admitting: Adult Health

## 2018-05-02 VITALS — BP 116/68 | HR 74 | Temp 98.1°F | Ht 69.5 in | Wt 257.8 lb

## 2018-05-02 DIAGNOSIS — R32 Unspecified urinary incontinence: Secondary | ICD-10-CM

## 2018-05-02 DIAGNOSIS — R3911 Hesitancy of micturition: Secondary | ICD-10-CM

## 2018-05-02 DIAGNOSIS — R350 Frequency of micturition: Secondary | ICD-10-CM

## 2018-05-02 DIAGNOSIS — Z1211 Encounter for screening for malignant neoplasm of colon: Secondary | ICD-10-CM

## 2018-05-02 DIAGNOSIS — Z0000 Encounter for general adult medical examination without abnormal findings: Secondary | ICD-10-CM

## 2018-05-02 DIAGNOSIS — F17200 Nicotine dependence, unspecified, uncomplicated: Secondary | ICD-10-CM

## 2018-05-02 LAB — POC HEMOCCULT BLD/STL (OFFICE/1-CARD/DIAGNOSTIC): FECAL OCCULT BLD: NEGATIVE

## 2018-05-02 NOTE — Assessment & Plan Note (Signed)
  Continue to drink plenty of water and follow Mediterranean diet. Increase regular exercise, recommend swimming, stationary biking. Continue to reduce-stop tobacco use. Referral to Urologist placed, re: change in urinary habits and unable to completely palpate (feel) prostate during examination today. Recommend annual follow-up- physical with fasting labs.

## 2018-05-02 NOTE — Assessment & Plan Note (Signed)
Declined smoking cessation  Currently smoking pack every 2-3 days

## 2018-05-02 NOTE — Assessment & Plan Note (Addendum)
Referral to Urologist placed, re: change in urinary habits and unable to completely palpate prostate during examination today due to body habitus. Change in urinary habits the last year He denies 1st degree family hx of Prostate ca

## 2018-05-02 NOTE — Patient Instructions (Addendum)
Preventive Care for Adults, Male A healthy lifestyle and preventive care can promote health and wellness. Preventive health guidelines for men include the following key practices:  A routine yearly physical is a good way to check with your health care provider about your health and preventative screening. It is a chance to share any concerns and updates on your health and to receive a thorough exam.  Visit your dentist for a routine exam and preventative care every 6 months. Brush your teeth twice a day and floss once a day. Good oral hygiene prevents tooth decay and gum disease.  The frequency of eye exams is based on your age, health, family medical history, use of contact lenses, and other factors. Follow your health care provider's recommendations for frequency of eye exams.  Eat a healthy diet. Foods such as vegetables, fruits, whole grains, low-fat dairy products, and lean protein foods contain the nutrients you need without too many calories. Decrease your intake of foods high in solid fats, added sugars, and salt. Eat the right amount of calories for you.Get information about a proper diet from your health care provider, if necessary.  Regular physical exercise is one of the most important things you can do for your health. Most adults should get at least 150 minutes of moderate-intensity exercise (any activity that increases your heart rate and causes you to sweat) each week. In addition, most adults need muscle-strengthening exercises on 2 or more days a week.  Maintain a healthy weight. The body mass index (BMI) is a screening tool to identify possible weight problems. It provides an estimate of body fat based on height and weight. Your health care provider can find your BMI and can help you achieve or maintain a healthy weight.For adults 20 years and older:  A BMI below 18.5 is considered underweight.  A BMI of 18.5 to 24.9 is normal.  A BMI of 25 to 29.9 is considered  overweight.  A BMI of 30 and above is considered obese.  Maintain normal blood lipids and cholesterol levels by exercising and minimizing your intake of saturated fat. Eat a balanced diet with plenty of fruit and vegetables. Blood tests for lipids and cholesterol should begin at age 22 and be repeated every 5 years. If your lipid or cholesterol levels are high, you are over 50, or you are at high risk for heart disease, you may need your cholesterol levels checked more frequently.Ongoing high lipid and cholesterol levels should be treated with medicines if diet and exercise are not working.  If you smoke, find out from your health care provider how to quit. If you do not use tobacco, do not start.  Lung cancer screening is recommended for adults aged 87-80 years who are at high risk for developing lung cancer because of a history of smoking. A yearly low-dose CT scan of the lungs is recommended for people who have at least a 30-pack-year history of smoking and are a current smoker or have quit within the past 15 years. A pack year of smoking is smoking an average of 1 pack of cigarettes a day for 1 year (for example: 1 pack a day for 30 years or 2 packs a day for 15 years). Yearly screening should continue until the smoker has stopped smoking for at least 15 years. Yearly screening should be stopped for people who develop a health problem that would prevent them from having lung cancer treatment.  If you choose to drink alcohol, do not have more  than 2 drinks per day. One drink is considered to be 12 ounces (355 mL) of beer, 5 ounces (148 mL) of wine, or 1.5 ounces (44 mL) of liquor.  Avoid use of street drugs. Do not share needles with anyone. Ask for help if you need support or instructions about stopping the use of drugs.  High blood pressure causes heart disease and increases the risk of stroke. Your blood pressure should be checked at least every 1-2 years. Ongoing high blood pressure should be  treated with medicines, if weight loss and exercise are not effective.  If you are 34-90 years old, ask your health care provider if you should take aspirin to prevent heart disease.  Diabetes screening is done by taking a blood sample to check your blood glucose level after you have not eaten for a certain period of time (fasting). If you are not overweight and you do not have risk factors for diabetes, you should be screened once every 3 years starting at age 35. If you are overweight or obese and you are 70-84 years of age, you should be screened for diabetes every year as part of your cardiovascular risk assessment.  Colorectal cancer can be detected and often prevented. Most routine colorectal cancer screening begins at the age of 18 and continues through age 69. However, your health care provider may recommend screening at an earlier age if you have risk factors for colon cancer. On a yearly basis, your health care provider may provide home test kits to check for hidden blood in the stool. Use of a small camera at the end of a tube to directly examine the colon (sigmoidoscopy or colonoscopy) can detect the earliest forms of colorectal cancer. Talk to your health care provider about this at age 71, when routine screening begins. Direct exam of the colon should be repeated every 5-10 years through age 18, unless early forms of precancerous polyps or small growths are found.  People who are at an increased risk for hepatitis B should be screened for this virus. You are considered at high risk for hepatitis B if:  You were born in a country where hepatitis B occurs often. Talk with your health care provider about which countries are considered high risk.  Your parents were born in a high-risk country and you have not received a shot to protect against hepatitis B (hepatitis B vaccine).  You have HIV or AIDS.  You use needles to inject street drugs.  You live with, or have sex with, someone who  has hepatitis B.  You are a man who has sex with other men (MSM).  You get hemodialysis treatment.  You take certain medicines for conditions such as cancer, organ transplantation, and autoimmune conditions.  Hepatitis C blood testing is recommended for all people born from [birth year] through 1965 and any individual with known risks for hepatitis C.  Practice safe sex. Use condoms and avoid high-risk sexual practices to reduce the spread of sexually transmitted infections (STIs). STIs include gonorrhea, chlamydia, syphilis, trichomonas, herpes, HPV, and human immunodeficiency virus (HIV). Herpes, HIV, and HPV are viral illnesses that have no cure. They can result in disability, cancer, and death.  If you are a man who has sex with other men, you should be screened at least once per year for:  HIV.  Urethral, rectal, and pharyngeal infection of gonorrhea, chlamydia, or both.  If you are at risk of being infected with HIV, it is recommended that you take a  prescription medicine daily to prevent HIV infection. This is called preexposure prophylaxis (PrEP). You are considered at risk if:  You are a man who has sex with other men (MSM) and have other risk factors.  You are a heterosexual man, are sexually active, and are at increased risk for HIV infection.  You take drugs by injection.  You are sexually active with a partner who has HIV.  Talk with your health care provider about whether you are at high risk of being infected with HIV. If you choose to begin PrEP, you should first be tested for HIV. You should then be tested every 3 months for as long as you are taking PrEP.  A one-time screening for abdominal aortic aneurysm (AAA) and surgical repair of large AAAs by ultrasound are recommended for men ages 44 to 66 years who are current or former smokers.  Healthy men should no longer receive prostate-specific antigen (PSA) blood tests as part of routine cancer screening. Talk with your health  care provider about prostate cancer screening.  Testicular cancer screening is not recommended for adult males who have no symptoms. Screening includes self-exam, a health care provider exam, and other screening tests. Consult with your health care provider about any symptoms you have or any concerns you have about testicular cancer.  Use sunscreen. Apply sunscreen liberally and repeatedly throughout the day. You should seek shade when your shadow is shorter than you. Protect yourself by wearing long sleeves, pants, a wide-brimmed hat, and sunglasses year round, whenever you are outdoors.  Once a month, do a whole-body skin exam, using a mirror to look at the skin on your back. Tell your health care provider about new moles, moles that have irregular borders, moles that are larger than a pencil eraser, or moles that have changed in shape or color.  Stay current with required vaccines (immunizations).  Influenza vaccine. All adults should be immunized every year.  Tetanus, diphtheria, and acellular pertussis (Td, Tdap) vaccine. An adult who has not previously received Tdap or who does not know his vaccine status should receive 1 dose of Tdap. This initial dose should be followed by tetanus and diphtheria toxoids (Td) booster doses every 10 years. Adults with an unknown or incomplete history of completing a 3-dose immunization series with Td-containing vaccines should begin or complete a primary immunization series including a Tdap dose. Adults should receive a Td booster every 10 years.  Varicella vaccine. An adult without evidence of immunity to varicella should receive 2 doses or a second dose if he has previously received 1 dose.  Human papillomavirus (HPV) vaccine. Males aged 11-21 years who have not received the vaccine previously should receive the 3-dose series. Males aged 22-26 years may be immunized. Immunization is recommended through the age of 23 years for any male who has sex with males  and did not get any or all doses earlier. Immunization is recommended for any person with an immunocompromised condition through the age of 72 years if he did not get any or all doses earlier. During the 3-dose series, the second dose should be obtained 4-8 weeks after the first dose. The third dose should be obtained 24 weeks after the first dose and 16 weeks after the second dose.  Zoster vaccine. One dose is recommended for adults aged 23 years or older unless certain conditions are present.  Measles, mumps, and rubella (MMR) vaccine. Adults born before [birth year] generally are considered immune to measles and mumps. Adults born in [birth year]  or later should have 1 or more doses of MMR vaccine unless there is a contraindication to the vaccine or there is laboratory evidence of immunity to each of the three diseases. A routine second dose of MMR vaccine should be obtained at least 28 days after the first dose for students attending postsecondary schools, health care workers, or international travelers. People who received inactivated measles vaccine or an unknown type of measles vaccine during 1963-1967 should receive 2 doses of MMR vaccine. People who received inactivated mumps vaccine or an unknown type of mumps vaccine before 1979 and are at high risk for mumps infection should consider immunization with 2 doses of MMR vaccine. Unvaccinated health care workers born before [birth year] who lack laboratory evidence of measles, mumps, or rubella immunity or laboratory confirmation of disease should consider measles and mumps immunization with 2 doses of MMR vaccine or rubella immunization with 1 dose of MMR vaccine.  Pneumococcal 13-valent conjugate (PCV13) vaccine. When indicated, a person who is uncertain of his immunization history and has no record of immunization should receive the PCV13 vaccine. All adults 9 years of age and older should receive this vaccine. An adult aged 69 years or older who has certain medical  conditions and has not been previously immunized should receive 1 dose of PCV13 vaccine. This PCV13 should be followed with a dose of pneumococcal polysaccharide (PPSV23) vaccine. Adults who are at high risk for pneumococcal disease should obtain the PPSV23 vaccine at least 8 weeks after the dose of PCV13 vaccine. Adults older than 51 years of age who have normal immune system function should obtain the PPSV23 vaccine dose at least 1 year after the dose of PCV13 vaccine.  Pneumococcal polysaccharide (PPSV23) vaccine. When PCV13 is also indicated, PCV13 should be obtained first. All adults aged 79 years and older should be immunized. An adult younger than age 43 years who has certain medical conditions should be immunized. Any person who resides in a nursing home or long-term care facility should be immunized. An adult smoker should be immunized. People with an immunocompromised condition and certain other conditions should receive both PCV13 and PPSV23 vaccines. People with human immunodeficiency virus (HIV) infection should be immunized as soon as possible after diagnosis. Immunization during chemotherapy or radiation therapy should be avoided. Routine use of PPSV23 vaccine is not recommended for American Indians, [place] Natives, or people younger than 65 years unless there are medical conditions that require PPSV23 vaccine. When indicated, people who have unknown immunization and have no record of immunization should receive PPSV23 vaccine. One-time revaccination 5 years after the first dose of PPSV23 is recommended for people aged 19-64 years who have chronic kidney failure, nephrotic syndrome, asplenia, or immunocompromised conditions. People who received 1-2 doses of PPSV23 before age 70 years should receive another dose of PPSV23 vaccine at age 79 years or later if at least 5 years have passed since the previous dose. Doses of PPSV23 are not needed for people immunized with PPSV23 at or after age 55  years.  Meningococcal vaccine. Adults with asplenia or persistent complement component deficiencies should receive 2 doses of quadrivalent meningococcal conjugate (MenACWY-D) vaccine. The doses should be obtained at least 2 months apart. Microbiologists working with certain meningococcal bacteria, [employer] recruits, people at risk during an outbreak, and people who travel to or live in countries with a high rate of meningitis should be immunized. A first-year college student up through age 64 years who is living in a residence hall should receive a  dose if he did not receive a dose on or after his 16th birthday. Adults who have certain high-risk conditions should receive one or more doses of vaccine.  Hepatitis A vaccine. Adults who wish to be protected from this disease, have chronic liver disease, work with hepatitis A-infected animals, work in hepatitis A research labs, or travel to or work in countries with a high rate of hepatitis A should be immunized. Adults who were previously unvaccinated and who anticipate close contact with an international adoptee during the first 60 days after arrival in the [place] from a country with a high rate of hepatitis A should be immunized.  Hepatitis B vaccine. Adults should be immunized if they wish to be protected from this disease, are under age 34 years and have diabetes, have chronic liver disease, have had more than one sex partner in the past 6 months, may be exposed to blood or other infectious body fluids, are household contacts or sex partners of hepatitis B positive people, are clients or workers in certain care facilities, or travel to or work in countries with a high rate of hepatitis B.  Haemophilus influenzae type b (Hib) vaccine. A previously unvaccinated person with asplenia or sickle cell disease or having a scheduled splenectomy should receive 1 dose of Hib vaccine. Regardless of previous immunization, a recipient of a hematopoietic stem cell  transplant should receive a 3-dose series 6-12 months after his successful transplant. Hib vaccine is not recommended for adults with HIV infection. Preventive Service / Frequency Ages 77 to 55  Blood pressure check.** / Every 3-5 years.  Lipid and cholesterol check.** / Every 5 years beginning at age 66.  Hepatitis C blood test.** / For any individual with known risks for hepatitis C.  Skin self-exam. / Monthly.  Influenza vaccine. / Every year.  Tetanus, diphtheria, and acellular pertussis (Tdap, Td) vaccine.** / Consult your health care provider. 1 dose of Td every 10 years.  Varicella vaccine.** / Consult your health care provider.  HPV vaccine. / 3 doses over 6 months, if 45 or younger.  Measles, mumps, rubella (MMR) vaccine.** / You need at least 1 dose of MMR if you were born in 1957 or later. You may also need a second dose.  Pneumococcal 13-valent conjugate (PCV13) vaccine.** / Consult your health care provider.  Pneumococcal polysaccharide (PPSV23) vaccine.** / 1 to 2 doses if you smoke cigarettes or if you have certain conditions.  Meningococcal vaccine.** / 1 dose if you are age 81 to 79 years and a Market researcher living in a residence hall, or have one of several medical conditions. You may also need additional booster doses.  Hepatitis A vaccine.** / Consult your health care provider.  Hepatitis B vaccine.** / Consult your health care provider.  Haemophilus influenzae type b (Hib) vaccine.** / Consult your health care provider. Ages 6 to 58  Blood pressure check.** / Every year.  Lipid and cholesterol check.** / Every 5 years beginning at age 89.  Lung cancer screening. / Every year if you are aged 84-80 years and have a 30-pack-year history of smoking and currently smoke or have quit within the past 15 years. Yearly screening is stopped once you have quit smoking for at least 15 years or develop a health problem that would prevent you from having  lung cancer treatment.  Fecal occult blood test (FOBT) of stool. / Every year beginning at age 90 and continuing until age 73. You may not have to do  this test if you get a colonoscopy every 10 years.  Flexible sigmoidoscopy** or colonoscopy.** / Every 5 years for a flexible sigmoidoscopy or every 10 years for a colonoscopy beginning at age 50 and continuing until age 75.  Hepatitis C blood test.** / For all people born from 1945 through 1965 and any individual with known risks for hepatitis C.  Skin self-exam. / Monthly.  Influenza vaccine. / Every year.  Tetanus, diphtheria, and acellular pertussis (Tdap/Td) vaccine.** / Consult your health care provider. 1 dose of Td every 10 years.  Varicella vaccine.** / Consult your health care provider.  Zoster vaccine.** / 1 dose for adults aged 60 years or older.  Measles, mumps, rubella (MMR) vaccine.** / You need at least 1 dose of MMR if you were born in 1957 or later. You may also need a second dose.  Pneumococcal 13-valent conjugate (PCV13) vaccine.** / Consult your health care provider.  Pneumococcal polysaccharide (PPSV23) vaccine.** / 1 to 2 doses if you smoke cigarettes or if you have certain conditions.  Meningococcal vaccine.** / Consult your health care provider.  Hepatitis A vaccine.** / Consult your health care provider.  Hepatitis B vaccine.** / Consult your health care provider.  Haemophilus influenzae type b (Hib) vaccine.** / Consult your health care provider. Ages 65 and over  Blood pressure check.** / Every year.  Lipid and cholesterol check.**/ Every 5 years beginning at age 20.  Lung cancer screening. / Every year if you are aged 55-80 years and have a 30-pack-year history of smoking and currently smoke or have quit within the past 15 years. Yearly screening is stopped once you have quit smoking for at least 15 years or develop a health problem that would prevent you from having lung cancer treatment.  Fecal  occult blood test (FOBT) of stool. / Every year beginning at age 50 and continuing until age 75. You may not have to do this test if you get a colonoscopy every 10 years.  Flexible sigmoidoscopy** or colonoscopy.** / Every 5 years for a flexible sigmoidoscopy or every 10 years for a colonoscopy beginning at age 50 and continuing until age 75.  Hepatitis C blood test.** / For all people born from 1945 through 1965 and any individual with known risks for hepatitis C.  Abdominal aortic aneurysm (AAA) screening.** / A one-time screening for ages 65 to 75 years who are current or former smokers.  Skin self-exam. / Monthly.  Influenza vaccine. / Every year.  Tetanus, diphtheria, and acellular pertussis (Tdap/Td) vaccine.** / 1 dose of Td every 10 years.  Varicella vaccine.** / Consult your health care provider.  Zoster vaccine.** / 1 dose for adults aged 60 years or older.  Pneumococcal 13-valent conjugate (PCV13) vaccine.** / 1 dose for all adults aged 65 years and older.  Pneumococcal polysaccharide (PPSV23) vaccine.** / 1 dose for all adults aged 65 years and older.  Meningococcal vaccine.** / Consult your health care provider.  Hepatitis A vaccine.** / Consult your health care provider.  Hepatitis B vaccine.** / Consult your health care provider.  Haemophilus influenzae type b (Hib) vaccine.** / Consult your health care provider. **Family history and personal history of risk and conditions may change your health care provider's recommendations.   This information is not intended to replace advice given to you by your health care provider. Make sure you discuss any questions you have with your health care provider.   Document Released: 07/14/2001 Document Revised: 06/08/2014 Document Reviewed: 10/13/2010 Elsevier Interactive Patient Education 2016   [place] refers to food and lifestyle choices that are based on the traditions of  countries located on the [employer]. This way of eating has been shown to help prevent certain conditions and improve outcomes for people who have chronic diseases, like kidney disease and heart disease. What are tips for following this plan? Lifestyle  Cook and eat meals together with your family, when possible.  Drink enough fluid to keep your urine clear or pale yellow.  Be physically active every day. This includes: ? Aerobic exercise like running or swimming. ? Leisure activities like gardening, walking, or housework.  Get 7-8 hours of sleep each night.. Reading food labels  Check the serving size of packaged foods. For foods such as rice and pasta, the serving size refers to the amount of cooked product, not dry.  Check the total fat in packaged foods. Avoid foods that have saturated fat or trans fats.  Check the ingredients list for added sugars, such as corn syrup. Shopping  At the grocery store, buy most of your food from the areas near the walls of the store. This includes: ? Fresh fruits and vegetables (produce). ? Grains, beans, nuts, and seeds. Some of these may be available in unpackaged forms or large amounts (in bulk). ? Fresh seafood. ? Poultry and eggs. ? Low-fat dairy products.  Buy whole ingredients instead of prepackaged foods.  Buy fresh fruits and vegetables in-season from local farmers markets.  Buy frozen fruits and vegetables in resealable bags.  If you do not have access to quality fresh seafood, buy precooked frozen shrimp or canned fish, such as tuna, salmon, or sardines.  Buy small amounts of raw or cooked vegetables, salads, or olives from the deli or salad bar at your store.  Stock your pantry so you always have certain foods on hand, such as olive oil, canned tuna, canned tomatoes, rice, pasta, and beans. Cooking  Cook foods with extra-virgin olive oil instead of using butter or other vegetable oils.  Have meat as a side dish, and  have vegetables or grains as your main dish. This means having meat in small portions or adding small amounts of meat to foods like pasta or stew.  Use beans or vegetables instead of meat in common dishes like chili or lasagna.  Experiment with different cooking methods. Try roasting or broiling vegetables instead of steaming or sauteing them.  Add frozen vegetables to soups, stews, pasta, or rice.  Add nuts or seeds for added healthy fat at each meal. You can add these to yogurt, salads, or vegetable dishes.  Marinate fish or vegetables using olive oil, lemon juice, garlic, and fresh herbs. Meal planning  Plan to eat 1 vegetarian meal one day each week. Try to work up to 2 vegetarian meals, if possible.  Eat seafood 2 or more times a week.  Have healthy snacks readily available, such as: ? Vegetable sticks with hummus. ? [place] yogurt. ? Fruit and nut trail mix.  Eat balanced meals throughout the week. This includes: ? Fruit: 2-3 servings a day ? Vegetables: 4-5 servings a day ? Low-fat dairy: 2 servings a day ? Fish, poultry, or lean meat: 1 serving a day ? Beans and legumes: 2 or more servings a week ? Nuts and seeds: 1-2 servings a day ? Whole grains: 6-8 servings a day ? Extra-virgin olive oil: 3-4 servings a day  Limit red meat and sweets to only a few servings a  month What are my food choices?  Mediterranean diet ? Recommended ? Grains: Whole-grain pasta. Brown rice. Bulgar wheat. Polenta. Couscous. Whole-wheat bread. [place]. ? Vegetables: Artichokes. Beets. Broccoli. Cabbage. Carrots. Eggplant. Green beans. Chard. Kale. Spinach. Onions. Leeks. Peas. Squash. Tomatoes. Peppers. Radishes. ? Fruits: Apples. Apricots. Avocado. Berries. Bananas. Cherries. Dates. Figs. Grapes. Lemons. Melon. Oranges. Peaches. Plums. Pomegranate. ? Meats and other protein foods: Beans. Almonds. Sunflower seeds. Pine nuts. Peanuts. [place]. Salmon. Scallops. Shrimp. [place]. Tilapia. Clams.  Oysters. Eggs. ? Dairy: Low-fat milk. Cheese. Greek yogurt. ? Beverages: Water. Red wine. Herbal tea. ? Fats and oils: Extra virgin olive oil. Avocado oil. Grape seed oil. ? Sweets and desserts: [place] yogurt with honey. Baked apples. Poached pears. Trail mix. ? Seasoning and other foods: Basil. Cilantro. Coriander. Cumin. Mint. Parsley. Sage. Rosemary. Tarragon. Garlic. Oregano. Thyme. Pepper. Balsalmic vinegar. Tahini. Hummus. Tomato sauce. Olives. Mushrooms. ? Limit these ? Grains: Prepackaged pasta or rice dishes. Prepackaged cereal with added sugar. ? Vegetables: Deep fried potatoes (french fries). ? Fruits: Fruit canned in syrup. ? Meats and other protein foods: Beef. Pork. Lamb. Poultry with skin. Hot dogs. [place] Salines. ? Dairy: Ice cream. Sour cream. Whole milk. ? Beverages: Juice. Sugar-sweetened soft drinks. Beer. Liquor and spirits. ? Fats and oils: Butter. Canola oil. Vegetable oil. Beef fat (tallow). Lard. ? Sweets and desserts: Cookies. Cakes. Pies. Candy. ? Seasoning and other foods: Mayonnaise. Premade sauces and marinades. ? The items listed may not be a complete list. Talk with your dietitian about what dietary choices are right for you. Summary  The Mediterranean diet includes both food and lifestyle choices.  Eat a variety of fresh fruits and vegetables, beans, nuts, seeds, and whole grains.  Limit the amount of red meat and sweets that you eat.  Talk with your health care provider about whether it is safe for you to drink red wine in moderation. This means 1 glass a day for nonpregnant women and 2 glasses a day for men. A glass of wine equals 5 oz (150 mL). This information is not intended to replace advice given to you by your health care provider. Make sure you discuss any questions you have with your health care provider. Document Released: 01/09/2016 Document Revised: 02/11/2016 Document Reviewed: 01/09/2016 Elsevier Interactive Patient Education  2018 [place] to drink plenty of water and follow Mediterranean diet. Increase regular exercise, recommend swimming, stationary biking. Continue to reduce-stop tobacco use. Referral to Urologist placed, re: change in urinary habits and unable to completely palpate (feel) prostate during examination today. Recommend annual follow-up- physical with fasting labs. NICE TO SEE YOU!

## 2018-08-15 ENCOUNTER — Telehealth (INDEPENDENT_AMBULATORY_CARE_PROVIDER_SITE_OTHER): Payer: Self-pay | Admitting: Family Medicine

## 2018-08-15 ENCOUNTER — Other Ambulatory Visit: Payer: Self-pay

## 2018-08-15 ENCOUNTER — Ambulatory Visit (INDEPENDENT_AMBULATORY_CARE_PROVIDER_SITE_OTHER): Payer: Self-pay

## 2018-08-15 ENCOUNTER — Encounter (INDEPENDENT_AMBULATORY_CARE_PROVIDER_SITE_OTHER): Payer: Self-pay | Admitting: Family Medicine

## 2018-08-15 ENCOUNTER — Ambulatory Visit (INDEPENDENT_AMBULATORY_CARE_PROVIDER_SITE_OTHER): Payer: Self-pay | Admitting: Family Medicine

## 2018-08-15 DIAGNOSIS — M5441 Lumbago with sciatica, right side: Secondary | ICD-10-CM

## 2018-08-15 DIAGNOSIS — M5442 Lumbago with sciatica, left side: Secondary | ICD-10-CM

## 2018-08-15 DIAGNOSIS — G8929 Other chronic pain: Secondary | ICD-10-CM

## 2018-08-15 DIAGNOSIS — M79604 Pain in right leg: Secondary | ICD-10-CM

## 2018-08-15 MED ORDER — PHENTERMINE HCL 37.5 MG PO TABS: 37.5000 mg | ORAL_TABLET | Freq: Every day | ORAL | 3 refills | Status: DC

## 2018-08-15 MED ORDER — TRAMADOL HCL 50 MG PO TABS: 50.0000 mg | ORAL_TABLET | Freq: Every evening | ORAL | 0 refills | Status: DC | PRN

## 2018-08-15 NOTE — Progress Notes (Signed)
Office Visit Note   Patient: Ronald Carter           Date of Birth: 1965-06-10           MRN: [id number] Visit Date: 08/15/2018 Requested by: Julaine Fusi, NP [address] [place], [place] [address] PCP: Julaine Fusi, NP  Subjective: Chief Complaint  Patient presents with   Right Leg - Pain    Pain from buttock down to foot. Swelling and redness in the leg. Weakness in both legs.  Walks with a quad cane.   Right Foot - Pain    Pain on top of foot - area swells.    HPI: He is here with low back and right greater than left leg pain.  Symptoms started a few months ago, no injury.  He started noticing pain in the right posterior hip with radiation down the back of his leg all the way to the foot.  He then started having it on the left side as well.  Now he notices weakness in both quadriceps when trying to stand up out of a chair.  His calf muscles also seem like they are atrophying.  He has a history of gout but he does not feel like this is a gout attack.  He has a history of lumbar fusion in 2012 but he was having mainly left-sided symptoms at that point.  Denies any bowel or bladder dysfunction.  He is using a cane for support.               ROS: Denies fevers, chills, night sweats, unintentional weight change.   all other systems were reviewed and are negative.  Objective: Vital Signs: There were no vitals taken for this visit.  Physical Exam:  General:  Alert and oriented, in no acute distress. Pulm:  Breathing unlabored. Psy:  Normal mood, congruent affect. Skin: No rash on his low back or legs. Low back: Tender in the gluteus medius on the right.  No midline tenderness.  Straight leg raise negative bilaterally, he has 4+/5 weakness of both quadriceps when standing up out of a chair.  Very slight weakness with ankle plantarflexion on both sides.  Otherwise lower extremity strength and reflexes are normal.  Imaging: X-rays lumbar spine with flexion/extension: Fusion  hardware looks stable compared to 2014, no sign of loosening.  There are degenerative changes at L4-5 and L3-4 compared to 2014.  No instability with flexion/extension.    Assessment & Plan: 1.  Low back and bilateral leg pain with slight weakness in quadriceps and calves -Discussed with patient and elected to try physical therapy.  If he fails to improve then probably MRI scan. -He has struggled with his weight and feels that it is contributing to his chronic pain.  He would like to try phentermine so I gave him a prescription for the next 3 months.  We will see him back in 1 month to check his weight and blood pressure.     Procedures: No procedures performed  No notes on file     PMFS History: Patient Active Problem List   Diagnosis Date Noted   Urinary hesitancy 05/02/2018   Tobacco use disorder 01/24/2018   Healthcare maintenance 01/24/2018   Anxiety 01/24/2018   Depression 01/24/2018   Past Medical History:  Diagnosis Date   Anxiety    Chronic back pain    Chronic knee pain    left   Depression    GERD (gastroesophageal reflux disease)  Family History  Problem Relation Age of Onset   COPD Mother    Diabetes Father    Heart disease Father    Heart attack Father    Fibromyalgia Sister    Hypertension Brother    Healthy Sister    Healthy Sister    Hypertension Brother    Hypertension Brother    Hypertension Brother    Hypertension Brother     Past Surgical History:  Procedure Laterality Date   BACK SURGERY     KNEE SURGERY     Social History   Occupational History   Not on file  Tobacco Use   Smoking status: Current Every Day Smoker    Packs/day: 0.50    Years: 25.00    Pack years: 12.50    Types: Cigarettes   Smokeless tobacco: Never Used  Substance and Sexual Activity   Alcohol use: Not Currently   Drug use: No   Sexual activity: Yes    Birth control/protection: None

## 2018-08-15 NOTE — Telephone Encounter (Signed)
I called and advised the patient the Rx has been sent in to the pharmacy.

## 2018-08-15 NOTE — Telephone Encounter (Signed)
Tramadol rx sent

## 2018-08-15 NOTE — Telephone Encounter (Signed)
Patient called asked if he can get something prescribed for pain. Patient uses the [facility] on [address].. The number to contact patient is [phone]

## 2018-08-15 NOTE — Telephone Encounter (Signed)
Please advise 

## 2018-09-12 ENCOUNTER — Telehealth (INDEPENDENT_AMBULATORY_CARE_PROVIDER_SITE_OTHER): Payer: Self-pay | Admitting: Radiology

## 2018-09-12 NOTE — Telephone Encounter (Signed)
Called and left voicemail asking patient to call back to answer pre screening questions for appointment on 4/14. Please ask questions if patient calls back.

## 2018-09-13 ENCOUNTER — Ambulatory Visit (INDEPENDENT_AMBULATORY_CARE_PROVIDER_SITE_OTHER): Payer: Self-pay | Admitting: Family Medicine

## 2018-09-20 ENCOUNTER — Telehealth (INDEPENDENT_AMBULATORY_CARE_PROVIDER_SITE_OTHER): Payer: Self-pay | Admitting: Family Medicine

## 2018-09-20 DIAGNOSIS — G8929 Other chronic pain: Secondary | ICD-10-CM

## 2018-09-20 DIAGNOSIS — M5442 Lumbago with sciatica, left side: Principal | ICD-10-CM

## 2018-09-20 DIAGNOSIS — M5441 Lumbago with sciatica, right side: Principal | ICD-10-CM

## 2018-09-20 MED ORDER — TRAMADOL HCL 50 MG PO TABS: 50.0000 mg | ORAL_TABLET | Freq: Every evening | ORAL | 0 refills | Status: DC | PRN

## 2018-09-20 NOTE — Telephone Encounter (Signed)
Patient is requesting refill Tramadol. He doesn't want to rs his appt until things settle from virus. [facility]. pts callback [phone]

## 2018-09-20 NOTE — Telephone Encounter (Signed)
I advised the patient about the Tramadol and the MRI.  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

## 2018-09-20 NOTE — Telephone Encounter (Signed)
Rx sent, MRI also ordered.

## 2018-09-20 NOTE — Telephone Encounter (Signed)
Please advise 

## 2018-11-14 ENCOUNTER — Telehealth: Payer: Self-pay | Admitting: Family Medicine

## 2018-11-14 ENCOUNTER — Ambulatory Visit
Admission: RE | Admit: 2018-11-14 | Discharge: 2018-11-14 | Disposition: A | Discharge status: Home / Self Care | Payer: Self-pay | Source: Ambulatory Visit | Attending: Family Medicine | Admitting: Family Medicine

## 2018-11-14 DIAGNOSIS — G8929 Other chronic pain: Secondary | ICD-10-CM

## 2018-11-14 NOTE — Telephone Encounter (Signed)
Lumbar MRI scan shows severe narrowing of the left-sided nerve opening at L3-4 due to a disc protrusion.  I think this is the source of his pain.  Presuming physical therapy did not help, we could try referring him for an epidural steroid injection unless he would prefer to insult with 1 of our spine surgeons.

## 2018-11-16 ENCOUNTER — Other Ambulatory Visit: Payer: Self-pay | Admitting: Family Medicine

## 2018-11-16 ENCOUNTER — Other Ambulatory Visit: Payer: Self-pay

## 2018-11-16 ENCOUNTER — Encounter: Payer: Self-pay | Admitting: Family Medicine

## 2018-11-16 ENCOUNTER — Ambulatory Visit (INDEPENDENT_AMBULATORY_CARE_PROVIDER_SITE_OTHER): Payer: Self-pay | Admitting: Family Medicine

## 2018-11-16 VITALS — BP 103/71 | HR 81 | Ht 69.5 in | Wt 241.0 lb

## 2018-11-16 DIAGNOSIS — M5441 Lumbago with sciatica, right side: Secondary | ICD-10-CM

## 2018-11-16 DIAGNOSIS — Z6835 Body mass index (BMI) 35.0-35.9, adult: Secondary | ICD-10-CM

## 2018-11-16 DIAGNOSIS — M5442 Lumbago with sciatica, left side: Secondary | ICD-10-CM

## 2018-11-16 DIAGNOSIS — G8929 Other chronic pain: Secondary | ICD-10-CM

## 2018-11-16 DIAGNOSIS — E6609 Other obesity due to excess calories: Secondary | ICD-10-CM

## 2018-11-16 MED ORDER — TRAMADOL HCL 50 MG PO TABS: 50.0000 mg | ORAL_TABLET | Freq: Every evening | ORAL | 0 refills | Status: DC | PRN

## 2018-11-16 MED ORDER — GABAPENTIN 300 MG PO CAPS: ORAL_CAPSULE | ORAL | 3 refills | Status: DC

## 2018-11-16 NOTE — Progress Notes (Signed)
Office Visit Note   Patient: Ronald Carter           Date of Birth: 1965-10-27           MRN: [id number] Visit Date: 11/16/2018 Requested by: Julaine Fusianford, Katy D, NP [address] [place],  [place] [id number] PCP: Julaine Fusianford, Katy D, NP  Subjective: Chief Complaint  Patient presents with  . Lower Back - Pain    MRI results review.  Marland Kitchen. weight & BP check    HPI: He is here for follow-up chronic low back and right leg pain.  Pain has not improved since last visit.  Pain in the right posterior hip radiating down the leg into the calf.  Occasional pain in the groin area, but all of his pain seems to be right-sided.  He actually states that he felt better than he has felt in a long time while lying in the MRI scanner.  This seemed to be a good position for him.  Otherwise he cannot stand very long or sit very long without having to shift positions.  Denies any bowel or bladder dysfunction.  Incidentally, he has lost 27 pounds since last visit using phentermine.  No side effects with the medication.  He is hoping to lose even more once he is able to exercise more regularly.  He smokes cigarettes and is interested in quitting at some point, but right now he wants to focus on losing weight.                ROS: Denies fevers or chills.  All other systems were reviewed and are negative.  Objective: Vital Signs: BP 103/71 (BP Location: Left Arm, Patient Position: Sitting, Cuff Size: Large)   Pulse 81   Ht 5' 9.5" (1.765 m)   Wt 241 lb (109.3 kg)   BMI 35.08 kg/m   Physical Exam:  General:  Alert and oriented, in no acute distress. Pulm:  Breathing unlabored. Psy:  Normal mood, congruent affect. Skin: No rash on his skin. Low back: He is mildly tender in the right sciatic notch.  Straight leg raise negative, lower extremity strength and reflexes are still normal.  Imaging: MRI images reviewed with patient showing L3-4 protrusion causing severe left foraminal narrowing and moderate right.   Assessment & Plan: 1.  Chronic right posterior hip and leg pain, suspect symptoms coming from the L3-4 level -Trial of gabapentin and home exercises.  If symptoms worsen he will call and I will arrange epidural steroid injection.  2.  Obesity -Continue with phentermine.     Procedures: No procedures performed  No notes on file     PMFS History: Patient Active Problem List   Diagnosis Date Noted  . Urinary hesitancy 05/02/2018  . Tobacco use disorder 01/24/2018  . Healthcare maintenance 01/24/2018  . Anxiety 01/24/2018  . Depression 01/24/2018   Past Medical History:  Diagnosis Date  . Anxiety   . Chronic back pain   . Chronic knee pain    left  . Depression   . GERD (gastroesophageal reflux disease)     Family History  Problem Relation Age of Onset  . COPD Mother   . Diabetes Father   . Heart disease Father   . Heart attack Father   . Fibromyalgia Sister   . Hypertension Brother   . Healthy Sister   . Healthy Sister   . Hypertension Brother   . Hypertension Brother   . Hypertension Brother   . Hypertension Brother  Past Surgical History:  Procedure Laterality Date  . BACK SURGERY    . KNEE SURGERY     Social History   Occupational History  . Not on file  Tobacco Use  . Smoking status: Current Every Day Smoker    Packs/day: 0.50    Years: 25.00    Pack years: 12.50    Types: Cigarettes  . Smokeless tobacco: Never Used  Substance and Sexual Activity  . Alcohol use: Not Currently  . Drug use: No  . Sexual activity: Yes    Birth control/protection: None

## 2018-11-16 NOTE — Telephone Encounter (Signed)
Coming in this morning for these results + weight and BP check.

## 2018-12-16 ENCOUNTER — Other Ambulatory Visit (INDEPENDENT_AMBULATORY_CARE_PROVIDER_SITE_OTHER): Payer: Self-pay | Admitting: Family Medicine

## 2019-06-08 ENCOUNTER — Telehealth: Payer: Self-pay

## 2019-06-08 NOTE — Telephone Encounter (Signed)
Caller advise that we are not connected with [facility] and he will need to contact them for result

## 2019-08-16 ENCOUNTER — Other Ambulatory Visit: Payer: Self-pay

## 2019-08-16 ENCOUNTER — Encounter: Payer: Self-pay | Admitting: Family Medicine

## 2019-08-16 ENCOUNTER — Ambulatory Visit: Payer: Self-pay

## 2019-08-16 ENCOUNTER — Ambulatory Visit (INDEPENDENT_AMBULATORY_CARE_PROVIDER_SITE_OTHER): Payer: Self-pay | Admitting: Family Medicine

## 2019-08-16 DIAGNOSIS — M5441 Lumbago with sciatica, right side: Secondary | ICD-10-CM

## 2019-08-16 DIAGNOSIS — G8929 Other chronic pain: Secondary | ICD-10-CM

## 2019-08-16 DIAGNOSIS — M5442 Lumbago with sciatica, left side: Secondary | ICD-10-CM

## 2019-08-16 MED ORDER — TRAMADOL HCL 50 MG PO TABS: 50.0000 mg | ORAL_TABLET | Freq: Every evening | ORAL | 0 refills | Status: DC | PRN

## 2019-08-16 MED ORDER — PHENTERMINE HCL 37.5 MG PO TABS: ORAL_TABLET | ORAL | 3 refills | Status: DC

## 2019-08-16 NOTE — Progress Notes (Signed)
Office Visit Note   Patient: Ronald Carter           Date of Birth: [birth year]           MRN: [id number] Visit Date: 08/16/2019 Requested by: Julaine Fusi, NP [address] [place],  [place] [address] PCP: Julaine Fusi, NP  Subjective: Chief Complaint  Patient presents with  . Lower Back - Pain    Intermittent sharp pains in the left lower back. Fell backward on porch steps 2 weeks ago. Has run out of his medications.    HPI: He is here with worsening chronic low back pain.  Pain is gotten steadily worse in the past few months with intermittent sharp stabbing pains near the midline lumbar area and radiating toward the left side but not down the legs.  2 weeks ago he fell backward and landed on his porch steps his pain is gotten worse.  No incontinence.  Medications do not seem to be helping.  He cannot find a comfortable position.  He is concerned that a surgical screw has come loose.              ROS:   All other systems were reviewed and are negative.  Objective: Vital Signs: There were no vitals taken for this visit.  Physical Exam:  General:  Alert and oriented, in no acute distress. Pulm:  Breathing unlabored. Psy:  Normal mood, congruent affect. Skin: No bruising Low back: He is very tender in the paraspinous muscles at around the L4-5 level on both sides.  Negative straight leg raise.    Imaging: X-Rays with flexion/extension: Surgical hardware is intact, no sign of loosening.  No acute fracture.  Alignment is anatomic.   Assessment & Plan: 1.  Chronic low back pain -Referral for epidural steroid injection.  Refill tramadol as needed.     Procedures: No procedures performed  No notes on file     PMFS History: Patient Active Problem List   Diagnosis Date Noted  . Class 2 obesity due to excess calories without serious comorbidity with body mass index (BMI) of 35.0 to 35.9 in adult 11/16/2018  . Urinary hesitancy 05/02/2018  . Tobacco use disorder  01/24/2018  . Healthcare maintenance 01/24/2018  . Anxiety 01/24/2018  . Depression 01/24/2018   Past Medical History:  Diagnosis Date  . Anxiety   . Chronic back pain   . Chronic knee pain    left  . Depression   . GERD (gastroesophageal reflux disease)     Family History  Problem Relation Age of Onset  . COPD Mother   . Diabetes Father   . Heart disease Father   . Heart attack Father   . Fibromyalgia Sister   . Hypertension Brother   . Healthy Sister   . Healthy Sister   . Hypertension Brother   . Hypertension Brother   . Hypertension Brother   . Hypertension Brother     Past Surgical History:  Procedure Laterality Date  . BACK SURGERY    . KNEE SURGERY     Social History   Occupational History  . Not on file  Tobacco Use  . Smoking status: Current Every Day Smoker    Packs/day: 0.50    Years: 25.00    Pack years: 12.50    Types: Cigarettes  . Smokeless tobacco: Never Used  Substance and Sexual Activity  . Alcohol use: Not Currently  . Drug use: No  . Sexual activity: Yes  Birth control/protection: None

## 2019-09-07 ENCOUNTER — Ambulatory Visit (INDEPENDENT_AMBULATORY_CARE_PROVIDER_SITE_OTHER): Payer: Self-pay | Admitting: Physical Medicine and Rehabilitation

## 2019-09-07 ENCOUNTER — Ambulatory Visit: Payer: Self-pay

## 2019-09-07 ENCOUNTER — Encounter: Payer: Self-pay | Admitting: Physical Medicine and Rehabilitation

## 2019-09-07 ENCOUNTER — Other Ambulatory Visit: Payer: Self-pay

## 2019-09-07 VITALS — BP 131/69 | HR 83

## 2019-09-07 DIAGNOSIS — M5416 Radiculopathy, lumbar region: Secondary | ICD-10-CM

## 2019-09-07 MED ORDER — METHYLPREDNISOLONE ACETATE 80 MG/ML IJ SUSP
40.0000 mg | Freq: Once | INTRAMUSCULAR | Status: AC
  Administered 2019-09-07: 40 mg

## 2019-09-07 NOTE — Progress Notes (Signed)
 .  Numeric Pain Rating Scale and Functional Assessment Average Pain 6   In the last MONTH (on 0-10 scale) has pain interfered with the following?  1. General activity like being  able to carry out your everyday physical activities such as walking, climbing stairs, carrying groceries, or moving a chair?  Rating(7)   +Driver, -BT, -Dye Allergies.  

## 2019-09-11 NOTE — Progress Notes (Signed)
Ronald Carter - 54 y.o. male MRN [id number]  Date of birth: 1966/03/23  Office Visit Note: Visit Date: 09/07/2019 PCP: Esaw Grandchild, NP Referred by: Esaw Grandchild, NP  Subjective: Chief Complaint  Patient presents with  . Lower Back - Pain   HPI:  Ronald Carter is a 54 y.o. male who comes in today At the request of Dr. Eunice Blase for epidural injection at L3-4.  Patient's had prior lumbar fusion at L5-S1.  MRI from June does show L3-4 disc herniation with degenerative disc height loss and left foraminal narrowing.  The patient has failed conservative care including home exercise, medications, time and activity modification.  This injection will be diagnostic and hopefully therapeutic.  Please see requesting physician notes for further details and justification.   ROS Otherwise per HPI.  Assessment & Plan: Visit Diagnoses:  1. Lumbar radiculopathy     Plan: No additional findings.   Meds & Orders:  Meds ordered this encounter  Medications  . methylPREDNISolone acetate (DEPO-MEDROL) injection 40 mg    Orders Placed This Encounter  Procedures  . XR C-ARM NO REPORT  . Epidural Steroid injection    Follow-up: Return if symptoms worsen or fail to improve.   Procedures: No procedures performed  Lumbar Epidural Steroid Injection - Interlaminar Approach with Fluoroscopic Guidance  Patient: Ronald Carter      Date of Birth: 1965-10-03 MRN: [id number] PCP: Esaw Grandchild, NP      Visit Date: 09/07/2019   Universal Protocol:     Consent Given By: the patient  Position: PRONE  Additional Comments: Vital signs were monitored before and after the procedure. Patient was prepped and draped in the usual sterile fashion. The correct patient, procedure, and site was verified.   Injection Procedure Details:  Procedure Site One Meds Administered:  Meds ordered this encounter  Medications  . methylPREDNISolone acetate (DEPO-MEDROL) injection 40 mg     Laterality:  Right  Location/Site:  L3-L4  Needle size: 20 G  Needle type: Tuohy  Needle Placement: Paramedian epidural  Findings:   -Comments: Excellent flow of contrast into the epidural space.  Procedure Details: Using a paramedian approach from the side mentioned above, the region overlying the inferior lamina was localized under fluoroscopic visualization and the soft tissues overlying this structure were infiltrated with 4 ml. of 1% Lidocaine without Epinephrine. The Tuohy needle was inserted into the epidural space using a paramedian approach.   The epidural space was localized using loss of resistance along with lateral and bi-planar fluoroscopic views.  After negative aspirate for air, blood, and CSF, a 2 ml. volume of Isovue-250 was injected into the epidural space and the flow of contrast was observed. Radiographs were obtained for documentation purposes.    The injectate was administered into the level noted above.   Additional Comments:  The patient tolerated the procedure well Dressing: 2 x 2 sterile gauze and Band-Aid    Post-procedure details: Patient was observed during the procedure. Post-procedure instructions were reviewed.  Patient left the clinic in stable condition.    Clinical History: MRI LUMBAR SPINE WITHOUT CONTRAST  TECHNIQUE: Multiplanar, multisequence MR imaging of the lumbar spine was performed. No intravenous contrast was administered.  COMPARISON:  Noncontrast lumbar spine CT 02/11/2016. Lumbar CT myelogram 02/13/2011. Lumbar MRI 03/12/2010.  FINDINGS: Segmentation:  Standard.  Alignment:  Fused grade 1 anterolisthesis of L5 on S1.  Vertebrae: No fracture suspicious osseous lesion. L5-S1 posterior and interbody fusion. Progressive, prominent degenerative  marrow changes anteriorly at T12-L1 including edema.  Conus medullaris and cauda equina: Conus extends to the L1 level. Conus and cauda equina appear normal.  Paraspinal and other  soft tissues: Small parapelvic cysts in the kidneys bilaterally. Postoperative changes in the posterior lower lumbar soft tissues. No fluid collection.  Disc levels:  T12-L1: Disc desiccation and moderate disc space narrowing. Progressive, mild circumferential disc bulging without stenosis.  L1-2: Negative.  L2-3: Mild disc desiccation. Trace disc bulging and mild facet hypertrophy without stenosis.  L3-4: Disc desiccation and moderate disc space narrowing, slightly progressed from 2017. Circumferential disc bulging, a left subarticular and left foraminal disc protrusion, and mild facet and ligamentum flavum hypertrophy result in severe left lateral recess stenosis and moderate to severe left neural foraminal stenosis with potential left L4 nerve root impingement. The exiting left L3 nerve root could also be affected. No significant generalized spinal stenosis. Patent right neural foramen.  L4-5: Mild disc desiccation. Minimal disc bulging and moderate facet hypertrophy without significant stenosis.  L5-S1: Prior posterior decompression and fusion without significant stenosis.  IMPRESSION: 1. 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 2. Progressive T12-L1 disc degeneration with prominent edema and mild disc bulging. No stenosis. 3. L5-S1 fusion without stenosis.   Electronically Signed   By: Sebastian Ache M.D.   On: 11/14/2018 11:10     Objective:  VS:  HT:    WT:   BMI:     BP:131/69  HR:83bpm  TEMP: ( )  RESP:  Physical Exam  Ortho Exam Imaging: No results found.

## 2019-09-11 NOTE — Procedures (Signed)
Lumbar Epidural Steroid Injection - Interlaminar Approach with Fluoroscopic Guidance  Patient: Ronald Carter      Date of Birth: 01/04/1966 MRN: [id number] PCP: Julaine Fusi, NP      Visit Date: 09/07/2019   Universal Protocol:     Consent Given By: the patient  Position: PRONE  Additional Comments: Vital signs were monitored before and after the procedure. Patient was prepped and draped in the usual sterile fashion. The correct patient, procedure, and site was verified.   Injection Procedure Details:  Procedure Site One Meds Administered:  Meds ordered this encounter  Medications  . methylPREDNISolone acetate (DEPO-MEDROL) injection 40 mg     Laterality: Right  Location/Site:  L3-L4  Needle size: 20 G  Needle type: Tuohy  Needle Placement: Paramedian epidural  Findings:   -Comments: Excellent flow of contrast into the epidural space.  Procedure Details: Using a paramedian approach from the side mentioned above, the region overlying the inferior lamina was localized under fluoroscopic visualization and the soft tissues overlying this structure were infiltrated with 4 ml. of 1% Lidocaine without Epinephrine. The Tuohy needle was inserted into the epidural space using a paramedian approach.   The epidural space was localized using loss of resistance along with lateral and bi-planar fluoroscopic views.  After negative aspirate for air, blood, and CSF, a 2 ml. volume of Isovue-250 was injected into the epidural space and the flow of contrast was observed. Radiographs were obtained for documentation purposes.    The injectate was administered into the level noted above.   Additional Comments:  The patient tolerated the procedure well Dressing: 2 x 2 sterile gauze and Band-Aid    Post-procedure details: Patient was observed during the procedure. Post-procedure instructions were reviewed.  Patient left the clinic in stable condition.

## 2020-02-13 ENCOUNTER — Other Ambulatory Visit: Payer: Self-pay | Admitting: Family Medicine

## 2020-02-13 MED ORDER — PHENTERMINE HCL 37.5 MG PO TABS: ORAL_TABLET | ORAL | 3 refills | Status: DC

## 2020-02-16 ENCOUNTER — Other Ambulatory Visit: Payer: Self-pay | Admitting: Family Medicine

## 2020-02-16 MED ORDER — PHENTERMINE HCL 37.5 MG PO TABS: ORAL_TABLET | ORAL | 3 refills | Status: DC

## 2020-03-15 ENCOUNTER — Ambulatory Visit (INDEPENDENT_AMBULATORY_CARE_PROVIDER_SITE_OTHER): Payer: Self-pay | Admitting: Physician Assistant

## 2020-03-15 ENCOUNTER — Ambulatory Visit: Payer: Self-pay

## 2020-03-15 ENCOUNTER — Other Ambulatory Visit: Payer: Self-pay

## 2020-03-15 ENCOUNTER — Encounter: Payer: Self-pay | Admitting: Physician Assistant

## 2020-03-15 DIAGNOSIS — M25562 Pain in left knee: Secondary | ICD-10-CM

## 2020-03-15 MED ORDER — LIDOCAINE HCL 1 % IJ SOLN
1.0000 mL | INTRAMUSCULAR | Status: AC | PRN
  Administered 2020-03-15: 1 mL

## 2020-03-15 MED ORDER — METHYLPREDNISOLONE ACETATE 40 MG/ML IJ SUSP
40.0000 mg | INTRAMUSCULAR | Status: AC | PRN
  Administered 2020-03-15: 40 mg via INTRA_ARTICULAR

## 2020-03-15 MED ORDER — HYDROCODONE-ACETAMINOPHEN 5-325 MG PO TABS: 1.0000 | ORAL_TABLET | ORAL | 0 refills | Status: DC | PRN

## 2020-03-15 NOTE — Progress Notes (Signed)
Office Visit Note   Patient: Ronald Carter           Date of Birth: December 18, 1965           MRN: [id number] Visit Date: 03/15/2020              Requested by: No referring provider defined for this encounter. PCP: No primary care provider on file.  No chief complaint on file.     HPI: Patient is a 54 year old patient of Dr. Diamantina Providence with a long history of bilateral knee pain and arthritis.  He comes in today for an unexpected visit.  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  Following this he has had significant increase in pain especially in the back of his knee.  He denies any fever or chills.  He denies any specific trauma.  Assessment & Plan: Visit Diagnoses:  1. Left knee pain, unspecified chronicity     Plan: I did discuss with the patient aspirating his knee today.  He wanted me to aspirate posteriorly and I explained to him that this is not appropriate.  He would like to go forward with an aspiration today.  While aspirating approximately 25 cc of fluid he told me he could no longer tolerate it.  Unfortunately I was unable to place steroid in his knee.  I will place him on oral steroids.  He is to ice his knee over the weekend and I have given him a limited amount of hydrocodone.  He does have a knee brace and he should wear this as well.  Follow-up with Dr. August Saucer next week.  If he has any acute exacerbation he should go to an urgent care  Follow-Up Instructions: No follow-ups on file.   Ortho Exam  Patient is alert, oriented, no adenopathy, well-dressed, normal affect, normal respiratory effort. Left knee patient has a significant antalgic gait.  No cellulitis he does have a significant effusion.  Tenderness in the posterior knee.  Is very hesitant to lift his knee or bend it.  No evidence of an infective process  Imaging: No results found. No images are attached to the encounter.  Labs: Lab Results  Component Value Date   HGBA1C 5.7  (H) 04/27/2018     Lab Results  Component Value Date   ALBUMIN 4.4 04/27/2018    No results found for: MG No results found for: VD25OH  No results found for: PREALBUMIN CBC EXTENDED Latest Ref Rng & Units 04/27/2018 07/23/2010  WBC 3.4 - 10.8 x10E3/uL 4.2 5.9  RBC 4.14 - 5.80 x10E6/uL 5.22 5.13  HGB 13.0 - 17.7 g/dL 62.9 52.8  HCT 41.3 - 24.4 % 45.3 47.3  PLT 150 - 450 x10E3/uL 251 242  NEUTROABS 1 - 7 x10E3/uL 2.0 3.0  LYMPHSABS 0 - 3 x10E3/uL 1.7 1.8     There is no height or weight on file to calculate BMI.  Orders:  Orders Placed This Encounter  Procedures  . XR Knee 1-2 Views Left   Meds ordered this encounter  Medications  . HYDROcodone-acetaminophen (NORCO/VICODIN) 5-325 MG tablet    Sig: Take 1 tablet by mouth every 4 (four) hours as needed for moderate pain.    Dispense:  20 tablet    Refill:  0     Procedures: Large Joint Inj on 03/15/2020 2:51 PM Indications: pain and diagnostic evaluation Details: 22 G 1.5 in needle, superolateral approach  Arthrogram: No  Medications: 40 mg methylPREDNISolone acetate  40 MG/ML; 1 mL lidocaine 1 % Aspirate: yellow Outcome: tolerated well, no immediate complications Procedure, treatment alternatives, risks and benefits explained, specific risks discussed. Consent was given by the patient.      Clinical Data: No additional findings.  ROS:  All other systems negative, except as noted in the HPI. Review of Systems  Objective: Vital Signs: There were no vitals taken for this visit.  Specialty Comments:  No specialty comments available.  PMFS History: Patient Active Problem List   Diagnosis Date Noted  . Class 2 obesity due to excess calories without serious comorbidity with body mass index (BMI) of 35.0 to 35.9 in adult 11/16/2018  . Urinary hesitancy 05/02/2018  . Tobacco use disorder 01/24/2018  . Healthcare maintenance 01/24/2018  . Anxiety 01/24/2018  . Depression 01/24/2018   Past Medical  History:  Diagnosis Date  . Anxiety   . Chronic back pain   . Chronic knee pain    left  . Depression   . GERD (gastroesophageal reflux disease)     Family History  Problem Relation Age of Onset  . COPD Mother   . Diabetes Father   . Heart disease Father   . Heart attack Father   . Fibromyalgia Sister   . Hypertension Brother   . Healthy Sister   . Healthy Sister   . Hypertension Brother   . Hypertension Brother   . Hypertension Brother   . Hypertension Brother     Past Surgical History:  Procedure Laterality Date  . BACK SURGERY    . KNEE SURGERY     Social History   Occupational History  . Not on file  Tobacco Use  . Smoking status: Current Every Day Smoker    Packs/day: 0.50    Years: 25.00    Pack years: 12.50    Types: Cigarettes  . Smokeless tobacco: Never Used  Substance and Sexual Activity  . Alcohol use: Not Currently  . Drug use: No  . Sexual activity: Yes    Birth control/protection: None

## 2020-03-19 ENCOUNTER — Telehealth: Payer: Self-pay

## 2020-03-19 ENCOUNTER — Ambulatory Visit: Payer: Self-pay

## 2020-03-19 ENCOUNTER — Ambulatory Visit (INDEPENDENT_AMBULATORY_CARE_PROVIDER_SITE_OTHER): Payer: Self-pay | Admitting: Family Medicine

## 2020-03-19 ENCOUNTER — Encounter: Payer: Self-pay | Admitting: Family Medicine

## 2020-03-19 ENCOUNTER — Other Ambulatory Visit: Payer: Self-pay

## 2020-03-19 DIAGNOSIS — M25562 Pain in left knee: Secondary | ICD-10-CM

## 2020-03-19 MED ORDER — OXYCODONE-ACETAMINOPHEN 5-325 MG PO TABS: 1.0000 | ORAL_TABLET | Freq: Four times a day (QID) | ORAL | 0 refills | Status: DC | PRN

## 2020-03-19 NOTE — Telephone Encounter (Signed)
I called and advised the patient. 

## 2020-03-19 NOTE — Telephone Encounter (Signed)
Ok to stop prednisone

## 2020-03-19 NOTE — Telephone Encounter (Signed)
Patient called he stated he just received a injection and is wondering if he should continue taking prednisone. Call back:[phone]

## 2020-03-19 NOTE — Progress Notes (Signed)
Office Visit Note   Patient: Ronald Carter           Date of Birth: Oct 28, 1965           MRN: [id number] Visit Date: 03/19/2020 Requested by: No referring provider defined for this encounter. PCP: No primary care provider on file.  Subjective: Chief Complaint  Patient presents with  . Left Knee - Pain    Pain in posterior knee. Was seen 10/15 for this - see note - no improvement.Unable to bend the knee, due to pain. Ambulating with rolling walker.    HPI: He is here with severe left knee pain.  Posterior pain for the past week or so.  He came in a few days ago and did not tolerate aspiration, after about 25 cc he requested that the needle be removed and cortisone was not administered.  Pain seems mostly on the posterior aspect, especially posterior lateral.  He is not sure what caused it, but he was doing a lot of work on a vehicle and perhaps that could have contributed.  Feels better to have his knee fully extended, he cannot flex it past about 45 degrees.              ROS:   All other systems were reviewed and are negative.  Objective: Vital Signs: There were no vitals taken for this visit.  Physical Exam:  General:  Alert and oriented, in no acute distress. Pulm:  Breathing unlabored. Psy:  Normal mood, congruent affect. Skin: No erythema Left knee: He has 1-2+ effusion with no warmth.  He has fullness and tenderness in the popliteal fossa posterior medial and posterior lateral.  Imaging: US Guided Needle Placement  Result Date: 03/19/2020 Limited diagnostic ultrasound of the left knee reveals a moderate sized popliteal cyst posterior medially and a loculated cystic structure posterior laterally. After sterile prep with Betadine, injected 5 cc 1% lidocaine without epinephrine then aspirated 10 cc of clear yellow synovial fluid with white particulate matter from the posterior medial cyst.   Assessment & Plan: 1.  Left knee pain with popliteal cyst, question lateral  meniscus tear. -Elected to aspirate the popliteal cyst and inject with cortisone today.  Injected with 6 mg betamethasone.  If symptoms persist we will order MRI scan.     Procedures: No procedures performed  No notes on file     PMFS History: Patient Active Problem List   Diagnosis Date Noted  . Class 2 obesity due to excess calories without serious comorbidity with body mass index (BMI) of 35.0 to 35.9 in adult 11/16/2018  . Urinary hesitancy 05/02/2018  . Tobacco use disorder 01/24/2018  . Healthcare maintenance 01/24/2018  . Anxiety 01/24/2018  . Depression 01/24/2018   Past Medical History:  Diagnosis Date  . Anxiety   . Chronic back pain   . Chronic knee pain    left  . Depression   . GERD (gastroesophageal reflux disease)     Family History  Problem Relation Age of Onset  . COPD Mother   . Diabetes Father   . Heart disease Father   . Heart attack Father   . Fibromyalgia Sister   . Hypertension Brother   . Healthy Sister   . Healthy Sister   . Hypertension Brother   . Hypertension Brother   . Hypertension Brother   . Hypertension Brother     Past Surgical History:  Procedure Laterality Date  . BACK SURGERY    . KNEE SURGERY  Social History   Occupational History  . Not on file  Tobacco Use  . Smoking status: Current Every Day Smoker    Packs/day: 0.50    Years: 25.00    Pack years: 12.50    Types: Cigarettes  . Smokeless tobacco: Never Used  Substance and Sexual Activity  . Alcohol use: Not Currently  . Drug use: No  . Sexual activity: Yes    Birth control/protection: None

## 2020-03-19 NOTE — Telephone Encounter (Signed)
Please advise 

## 2020-03-20 ENCOUNTER — Ambulatory Visit: Payer: Self-pay | Admitting: Family Medicine

## 2020-04-01 ENCOUNTER — Ambulatory Visit (INDEPENDENT_AMBULATORY_CARE_PROVIDER_SITE_OTHER): Payer: Self-pay | Admitting: Family Medicine

## 2020-04-01 ENCOUNTER — Other Ambulatory Visit: Payer: Self-pay

## 2020-04-01 ENCOUNTER — Encounter: Payer: Self-pay | Admitting: Family Medicine

## 2020-04-01 DIAGNOSIS — M25562 Pain in left knee: Secondary | ICD-10-CM

## 2020-04-01 NOTE — Progress Notes (Signed)
Office Visit Note   Patient: Ronald Carter           Date of Birth: March 04, 1966           MRN: [id number] Visit Date: 04/01/2020 Requested by: No referring provider defined for this encounter. PCP: No primary care provider on file.  Subjective: Chief Complaint  Patient presents with  . Left Knee - Pain    Can bend the knee, but continues to have pain in the knee. Swollen again.  Ambulating with a walker. Affecting his back.     HPI: He is here with persistent left knee pain.  Baker's cyst aspiration helped only a short time.  His knee is swollen and painful, especially in the posterior aspect.  Gerarda Gunther is having back pain as well because of his altered gait and having to use a walker.                ROS:   All other systems were reviewed and are negative.  Objective: Vital Signs: There were no vitals taken for this visit.  Physical Exam:  General:  Alert and oriented, in no acute distress. Pulm:  Breathing unlabored. Psy:  Normal mood, congruent affect. Skin: No erythema Left knee: 2-3+ effusion with no warmth.  Full active extension, flexion limited to about 90 degrees.  Imaging: No results found.  Assessment & Plan: 1.  Left knee DJD with effusion -Discussed options and elected to aspirate and inject with dextrose.  We will avoid MRI right now because he does not have insurance.  We can repeat this if needed.     Procedures: Left knee aspiration and injection: After sterile prep with Betadine, injected 5 cc 1% lidocaine without epinephrine, then aspirated 50 cc of serous synovial fluid with cartilaginous material, then injected 6 cc 1% lidocaine without epinephrine and 4 cc 50% dextrose from superolateral approach.    PMFS History: Patient Active Problem List   Diagnosis Date Noted  . Class 2 obesity due to excess calories without serious comorbidity with body mass index (BMI) of 35.0 to 35.9 in adult 11/16/2018  . Urinary hesitancy 05/02/2018  . Tobacco use  disorder 01/24/2018  . Healthcare maintenance 01/24/2018  . Anxiety 01/24/2018  . Depression 01/24/2018   Past Medical History:  Diagnosis Date  . Anxiety   . Chronic back pain   . Chronic knee pain    left  . Depression   . GERD (gastroesophageal reflux disease)     Family History  Problem Relation Age of Onset  . COPD Mother   . Diabetes Father   . Heart disease Father   . Heart attack Father   . Fibromyalgia Sister   . Hypertension Brother   . Healthy Sister   . Healthy Sister   . Hypertension Brother   . Hypertension Brother   . Hypertension Brother   . Hypertension Brother     Past Surgical History:  Procedure Laterality Date  . BACK SURGERY    . KNEE SURGERY     Social History   Occupational History  . Not on file  Tobacco Use  . Smoking status: Current Every Day Smoker    Packs/day: 0.50    Years: 25.00    Pack years: 12.50    Types: Cigarettes  . Smokeless tobacco: Never Used  Substance and Sexual Activity  . Alcohol use: Not Currently  . Drug use: No  . Sexual activity: Yes    Birth control/protection: None

## 2020-04-17 ENCOUNTER — Telehealth: Payer: Self-pay

## 2020-04-17 MED ORDER — OXYCODONE-ACETAMINOPHEN 5-325 MG PO TABS: 1.0000 | ORAL_TABLET | Freq: Four times a day (QID) | ORAL | 0 refills | Status: DC | PRN

## 2020-04-17 NOTE — Telephone Encounter (Signed)
I called and left voice mail on patient's mobile, advising him to check with his pharmacy.

## 2020-04-17 NOTE — Addendum Note (Signed)
Addended by: Lillia Carmel on: 04/17/2020 03:23 PM   Modules accepted: Orders

## 2020-04-17 NOTE — Telephone Encounter (Signed)
Patient called he is requesting Rx refill for oxycodone [phone]

## 2020-04-17 NOTE — Telephone Encounter (Signed)
Please advise 

## 2020-04-17 NOTE — Telephone Encounter (Signed)
Sent!

## 2020-05-21 ENCOUNTER — Telehealth: Payer: Self-pay | Admitting: Family Medicine

## 2020-05-21 MED ORDER — HYDROCODONE-ACETAMINOPHEN 5-325 MG PO TABS: 1.0000 | ORAL_TABLET | Freq: Every evening | ORAL | 0 refills | Status: DC | PRN

## 2020-05-21 NOTE — Telephone Encounter (Signed)
Patient called asked if he can Rx for pain medication called into his pharmacy    [phone] mom's number

## 2020-05-21 NOTE — Telephone Encounter (Signed)
I called and left a message with the patient's mom (this is the number patient asked [place] to call) for the patient to check with the pharmacy.

## 2020-05-21 NOTE — Telephone Encounter (Signed)
Please advise 

## 2020-06-04 ENCOUNTER — Ambulatory Visit: Payer: Self-pay | Admitting: Family Medicine

## 2020-06-11 ENCOUNTER — Ambulatory Visit: Payer: Self-pay | Admitting: Family Medicine

## 2020-06-19 ENCOUNTER — Ambulatory Visit: Payer: Self-pay | Admitting: Family Medicine

## 2020-07-10 ENCOUNTER — Ambulatory Visit: Payer: Self-pay | Admitting: Family Medicine

## 2020-07-15 ENCOUNTER — Ambulatory Visit (INDEPENDENT_AMBULATORY_CARE_PROVIDER_SITE_OTHER): Payer: Self-pay | Admitting: Family Medicine

## 2020-07-15 ENCOUNTER — Other Ambulatory Visit: Payer: Self-pay

## 2020-07-15 ENCOUNTER — Encounter: Payer: Self-pay | Admitting: Family Medicine

## 2020-07-15 DIAGNOSIS — Z8739 Personal history of other diseases of the musculoskeletal system and connective tissue: Secondary | ICD-10-CM | POA: Insufficient documentation

## 2020-07-15 DIAGNOSIS — M79641 Pain in right hand: Secondary | ICD-10-CM

## 2020-07-15 DIAGNOSIS — M79642 Pain in left hand: Secondary | ICD-10-CM

## 2020-07-15 MED ORDER — COLCHICINE 0.6 MG PO CAPS: 1.0000 | ORAL_CAPSULE | Freq: Two times a day (BID) | ORAL | 3 refills | Status: DC | PRN

## 2020-07-15 MED ORDER — METHYLPREDNISOLONE 4 MG PO TBPK: ORAL_TABLET | ORAL | 0 refills | Status: DC

## 2020-07-15 NOTE — Progress Notes (Signed)
Office Visit Note   Patient: Ronald Carter           Date of Birth: [birth year]           MRN: [id number] Visit Date: 07/15/2020 Requested by: No referring provider defined for this encounter. PCP: No primary care provider on file.  Subjective: Chief Complaint  Patient presents with  . Right Hand - Pain    Last week, the middle finger, proximal phalanx, was swollen and he had difficulty straightening the finger. That finger was already a little sore, but was exacerbated after his dog hit that finger while they were playing - the finger became warm/red. Right-hand dominate.   . Left Hand - Pain    Pain and swelling in the [facility] joint.    HPI: He is here with bilateral hand pain.  Symptoms started last week, no injury.  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  He had similar findings left hand thumb CMC area.  His pain has gotten a lot better since then.  He notes that he has had a history of gout in his foot and has had good results with colchicine in the past.  He has never had a known gout attack in his hands.               ROS:   All other systems were reviewed and are negative.  Objective: Vital Signs: There were no vitals taken for this visit.  Physical Exam:  General:  Alert and oriented, in no acute distress. Pulm:  Breathing unlabored. Psy:  Normal mood, congruent affect. Skin:  Mild erythema of right 3rd MCP.  Hands:  Slight synovitis of right 3rd MCP today, and left 1st CMC.  Good ROM, no triggering of fingers.  Imaging: No results found.  Assessment & Plan: 1.  Resolving bilateral hand pain, question gout. - Colchicine as needed. - Glucosamine and turmeric for arthritis symptoms. - Consider labs to check uric acid in the future.     Procedures: No procedures performed        PMFS History: Patient Active Problem List   Diagnosis Date  Noted  . Class 2 obesity due to excess calories without serious comorbidity with body mass index (BMI) of 35.0 to 35.9 in adult 11/16/2018  . Urinary hesitancy 05/02/2018  . Tobacco use disorder 01/24/2018  . Healthcare maintenance 01/24/2018  . Anxiety 01/24/2018  . Depression 01/24/2018   Past Medical History:  Diagnosis Date  . Anxiety   . Chronic back pain   . Chronic knee pain    left  . Depression   . GERD (gastroesophageal reflux disease)     Family History  Problem Relation Age of Onset  . COPD Mother   . Diabetes Father   . Heart disease Father   . Heart attack Father   . Fibromyalgia Sister   . Hypertension Brother   . Healthy Sister   . Healthy Sister   . Hypertension Brother   . Hypertension Brother   . Hypertension Brother   . Hypertension Brother     Past Surgical History:  Procedure Laterality Date  . BACK SURGERY    . KNEE SURGERY     Social History   Occupational History  . Not on file  Tobacco Use  . Smoking status: Current Every Day Smoker  Packs/day: 0.50    Years: 25.00    Pack years: 12.50    Types: Cigarettes  . Smokeless tobacco: Never Used  Substance and Sexual Activity  . Alcohol use: Not Currently  . Drug use: No  . Sexual activity: Yes    Birth control/protection: None

## 2020-07-15 NOTE — Patient Instructions (Signed)
   Glucosamine sulfate:  1,000 mg twice daily  Turmeric:  500 mg twice daily   

## 2020-08-02 ENCOUNTER — Ambulatory Visit: Payer: Self-pay | Admitting: Family Medicine

## 2020-08-21 ENCOUNTER — Ambulatory Visit: Payer: Self-pay | Admitting: Family Medicine

## 2020-09-09 ENCOUNTER — Encounter: Payer: Self-pay | Admitting: Orthopedic Surgery

## 2020-09-09 ENCOUNTER — Ambulatory Visit (INDEPENDENT_AMBULATORY_CARE_PROVIDER_SITE_OTHER): Payer: Self-pay | Admitting: Orthopedic Surgery

## 2020-09-09 ENCOUNTER — Ambulatory Visit: Payer: Self-pay

## 2020-09-09 VITALS — Ht 69.5 in | Wt 241.0 lb

## 2020-09-09 DIAGNOSIS — M25562 Pain in left knee: Secondary | ICD-10-CM

## 2020-09-09 MED ORDER — HYDROCODONE-ACETAMINOPHEN 5-325 MG PO TABS: 1.0000 | ORAL_TABLET | Freq: Every evening | ORAL | 0 refills | Status: DC | PRN

## 2020-09-09 NOTE — Progress Notes (Signed)
   Office Visit Note   Patient: Ronald Carter           Date of Birth: 1966/03/20           MRN: [id number] Visit Date: 09/09/2020 Requested by: No referring provider defined for this encounter. PCP: No primary care provider on file.  Subjective: Chief Complaint  Patient presents with  . Left Knee - Pain    HPI: He is here with left knee pain and swelling.  Symptoms started a couple days ago, no injury.  History of end-stage DJD.  History of gout as well.  He is taking colchicine for gout.  Last aspiration and injection in November helped until just recently.                ROS:   All other systems were reviewed and are negative.  Objective: Vital Signs: Ht 5' 9.5" (1.765 m)   Wt 241 lb (109.3 kg)   BMI 35.08 kg/m   Physical Exam:  General:  Alert and oriented, in no acute distress. Pulm:  Breathing unlabored. Psy:  Normal mood, congruent affect. Skin: There is no erythema, slight warmth. Left knee: 2+ effusion, diffusely tender, limited active range of motion.  Imaging: No results found.  Assessment & Plan: 1.  Left knee effusion with underlying DJD, history of gout. -Aspiration and injection today.  Hydrocodone as needed.  Follow-up as needed.     Procedures: Left knee aspiration and injection: After sterile prep with Betadine, injected 3 cc 0.25% bupivacaine then aspirated 70 cc of hazy yellow synovial fluid, then injected 40 mg Depo-Medrol from superolateral approach.       PMFS History: Patient Active Problem List   Diagnosis Date Noted  . History of gout 07/15/2020  . Class 2 obesity due to excess calories without serious comorbidity with body mass index (BMI) of 35.0 to 35.9 in adult 11/16/2018  . Urinary hesitancy 05/02/2018  . Tobacco use disorder 01/24/2018  . Healthcare maintenance 01/24/2018  . Anxiety 01/24/2018  . Depression 01/24/2018   Past Medical History:  Diagnosis Date  . Anxiety   . Chronic back pain   . Chronic knee pain    left   . Depression   . GERD (gastroesophageal reflux disease)     Family History  Problem Relation Age of Onset  . COPD Mother   . Diabetes Father   . Heart disease Father   . Heart attack Father   . Fibromyalgia Sister   . Hypertension Brother   . Healthy Sister   . Healthy Sister   . Hypertension Brother   . Hypertension Brother   . Hypertension Brother   . Hypertension Brother     Past Surgical History:  Procedure Laterality Date  . BACK SURGERY    . KNEE SURGERY     Social History   Occupational History  . Not on file  Tobacco Use  . Smoking status: Current Every Day Smoker    Packs/day: 0.50    Years: 25.00    Pack years: 12.50    Types: Cigarettes  . Smokeless tobacco: Never Used  Substance and Sexual Activity  . Alcohol use: Not Currently  . Drug use: No  . Sexual activity: Yes    Birth control/protection: None

## 2020-10-09 ENCOUNTER — Telehealth: Payer: Self-pay | Admitting: Family Medicine

## 2020-10-09 MED ORDER — HYDROCODONE-ACETAMINOPHEN 5-325 MG PO TABS: 1.0000 | ORAL_TABLET | Freq: Every evening | ORAL | 0 refills | Status: DC | PRN

## 2020-10-09 MED ORDER — PHENTERMINE HCL 37.5 MG PO TABS: ORAL_TABLET | ORAL | 3 refills | Status: AC

## 2020-10-09 NOTE — Telephone Encounter (Signed)
Please advise 

## 2020-10-09 NOTE — Telephone Encounter (Signed)
Patient called requesting a refill of hydrocodone and phentermine. Please call patient when medications have been call in.Please send to pharmacy on file. Patient phone number is [phone].

## 2020-10-17 ENCOUNTER — Telehealth: Payer: Self-pay | Admitting: Family Medicine

## 2020-10-17 NOTE — Telephone Encounter (Signed)
Patient called requesting a refill of colchicine. Please send to pharmacy on file. Patient phone number is [phone]

## 2020-10-18 MED ORDER — COLCHICINE 0.6 MG PO CAPS: 1.0000 | ORAL_CAPSULE | Freq: Two times a day (BID) | ORAL | 6 refills | Status: DC | PRN

## 2020-10-18 NOTE — Telephone Encounter (Signed)
Sent!

## 2020-12-18 ENCOUNTER — Telehealth: Payer: Self-pay

## 2020-12-18 MED ORDER — COLCHICINE 0.6 MG PO CAPS: 1.0000 | ORAL_CAPSULE | Freq: Two times a day (BID) | ORAL | 6 refills | Status: DC | PRN

## 2020-12-18 MED ORDER — HYDROCODONE-ACETAMINOPHEN 5-325 MG PO TABS: 1.0000 | ORAL_TABLET | Freq: Every evening | ORAL | 0 refills | Status: AC | PRN

## 2020-12-18 NOTE — Telephone Encounter (Signed)
Patient called he is requesting a rx refill for hydrocodone and colchicine call back:[phone]

## 2020-12-18 NOTE — Telephone Encounter (Signed)
Please advise 

## 2020-12-18 NOTE — Telephone Encounter (Signed)
I called and advised the patient. 

## 2020-12-26 ENCOUNTER — Other Ambulatory Visit: Payer: Self-pay

## 2020-12-26 ENCOUNTER — Ambulatory Visit (INDEPENDENT_AMBULATORY_CARE_PROVIDER_SITE_OTHER): Payer: Self-pay | Admitting: Family Medicine

## 2020-12-26 ENCOUNTER — Encounter: Payer: Self-pay | Admitting: Family Medicine

## 2020-12-26 VITALS — BP 110/74 | HR 91 | Ht 69.0 in | Wt 243.8 lb

## 2020-12-26 DIAGNOSIS — Z6835 Body mass index (BMI) 35.0-35.9, adult: Secondary | ICD-10-CM

## 2020-12-26 DIAGNOSIS — E6609 Other obesity due to excess calories: Secondary | ICD-10-CM

## 2020-12-26 DIAGNOSIS — F17200 Nicotine dependence, unspecified, uncomplicated: Secondary | ICD-10-CM

## 2020-12-26 DIAGNOSIS — Z0000 Encounter for general adult medical examination without abnormal findings: Secondary | ICD-10-CM

## 2020-12-26 DIAGNOSIS — M25512 Pain in left shoulder: Secondary | ICD-10-CM

## 2020-12-26 DIAGNOSIS — Z8739 Personal history of other diseases of the musculoskeletal system and connective tissue: Secondary | ICD-10-CM

## 2020-12-26 DIAGNOSIS — R739 Hyperglycemia, unspecified: Secondary | ICD-10-CM

## 2020-12-26 NOTE — Progress Notes (Signed)
Office Visit Note   Patient: Ronald Carter           Date of Birth: 1966/01/06           MRN: [id number] Visit Date: 12/26/2020 Requested by: No referring provider defined for this encounter. PCP: No primary care provider on file.  Subjective: Chief Complaint  Patient presents with   Left Shoulder - Pain    Intermittent pain x 1-1&1/2 weeks. Notices this mostly late in the evening, when he gets off work.   Other    Establish PCP/wellness visit.    HPI: He is here for a wellness exam.  He has not had 1 in several years.  Lately he has been struggling with depression.  It has affected his relationship with his girlfriend.  He has decided to seek treatment with an Personnel officer.  He also wants to make sure there is nothing medically wrong that could be affecting this.  His left shoulder started hurting about a week and a half ago, no definite injury.  It seems to be getting better but he asked me to check it.  He has a history of obesity but is losing weight with phentermine.  This also seems to help his concentration.  He thinks he has ADHD.  He has been able to quit drinking alcohol and feels much better.  He also essentially quit smoking cigarettes.  He has not had colonoscopy.                ROS:   All other systems were reviewed and are negative.  Objective: Vital Signs: BP 110/74 (BP Location: Left Arm, Patient Position: Sitting, Cuff Size: Large)   Pulse 91   Ht 5\' 9"  (1.753 m)   Wt 243 lb 12.8 oz (110.6 kg)   BMI 36.00 kg/m   Physical Exam:  General:  Alert and oriented, in no acute distress. Pulm:  Breathing unlabored. Psy:  Normal mood, congruent affect. Skin: No suspicious lesions seen. HEENT:  [place]/AT, PERRLA, EOM Full, no nystagmus.  Funduscopic examination within normal limits.  No conjunctival erythema.  Tympanic membranes are pearly gray with normal landmarks.  External ear canals are normal.  Nasal passages are clear.  Oropharynx is clear.  No  significant lymphadenopathy.  No thyromegaly or nodules.  2+ carotid pulses without bruits. CV: Regular rate and rhythm without murmurs, rubs, or gallops.  No peripheral edema.  2+ radial and posterior tibial pulses. Lungs: Clear to auscultation throughout with no wheezing or areas of consolidation. Abd: Bowel sounds are active, no hepatosplenomegaly or masses.  Soft and nontender.  No audible bruits.  No evidence of ascites. Extremities: No nail deformities.  2+ DTRs..  Left shoulder has full range of motion.  Intact rotator cuff strength.  Slight pain with passive impingement, especially abduction and internal rotation.   Imaging: No results found.  Assessment & Plan: Wellness examination -Labs drawn today.  Cologuard ordered.  2.  Left shoulder pain -Anti-inflammatories as needed.  Home exercises given.  Physical therapy or subacromial injection if symptoms persist.  3.  Obesity - Patient is interested in nutrition consult.  We will arrange this for him.  4.  Depression - Proceed with counseling.  5.  History of hyperglycemia - Check A1c today.      Procedures: No procedures performed        PMFS History: Patient Active Problem List   Diagnosis Date Noted   History of gout 07/15/2020   Class 2 obesity  due to excess calories without serious comorbidity with body mass index (BMI) of 35.0 to 35.9 in adult 11/16/2018   Urinary hesitancy 05/02/2018   Tobacco use disorder 01/24/2018   Healthcare maintenance 01/24/2018   Anxiety 01/24/2018   Depression 01/24/2018   Past Medical History:  Diagnosis Date   Anxiety    Chronic back pain    Chronic knee pain    left   Depression    GERD (gastroesophageal reflux disease)     Family History  Problem Relation Age of Onset   COPD Mother    Diabetes Father    Heart disease Father    Heart attack Father    Fibromyalgia Sister    Healthy Sister    Healthy Sister    Hypertension Brother    Hypertension Brother     Hypertension Brother    Hypertension Brother    Hypertension Brother    Cancer Maternal Grandmother    Stomach cancer Maternal Grandmother    Prostate cancer Other     Past Surgical History:  Procedure Laterality Date   BACK SURGERY     KNEE SURGERY     Social History   Occupational History   Not on file  Tobacco Use   Smoking status: Every Day    Packs/day: 0.50    Years: 25.00    Pack years: 12.50    Types: Cigarettes   Smokeless tobacco: Never  Substance and Sexual Activity   Alcohol use: Not Currently   Drug use: No   Sexual activity: Yes    Birth control/protection: None

## 2020-12-27 ENCOUNTER — Encounter: Payer: Self-pay | Admitting: Family Medicine

## 2020-12-27 ENCOUNTER — Telehealth: Payer: Self-pay | Admitting: Family Medicine

## 2020-12-27 LAB — CBC WITH DIFFERENTIAL/PLATELET
Absolute Monocytes: 449 cells/uL (ref 200–950)
Basophils Absolute: 20 cells/uL (ref 0–200)
Basophils Relative: 0.3 %
Eosinophils Absolute: 141 cells/uL (ref 15–500)
Eosinophils Relative: 2.1 %
HCT: 48.1 % (ref 38.5–50.0)
Hemoglobin: 16.7 g/dL (ref 13.2–17.1)
Lymphs Abs: 2178 cells/uL (ref 850–3900)
MCH: 31.5 pg (ref 27.0–33.0)
MCHC: 34.7 g/dL (ref 32.0–36.0)
MCV: 90.6 fL (ref 80.0–100.0)
MPV: 11.3 fL (ref 7.5–12.5)
Monocytes Relative: 6.7 %
Neutro Abs: 3913 cells/uL (ref 1500–7800)
Neutrophils Relative %: 58.4 %
Platelets: 299 10*3/uL (ref 140–400)
RBC: 5.31 10*6/uL (ref 4.20–5.80)
RDW: 12.9 % (ref 11.0–15.0)
Total Lymphocyte: 32.5 %
WBC: 6.7 10*3/uL (ref 3.8–10.8)

## 2020-12-27 LAB — COMPREHENSIVE METABOLIC PANEL
AG Ratio: 1.8 (calc) (ref 1.0–2.5)
ALT: 28 U/L (ref 9–46)
AST: 20 U/L (ref 10–35)
Albumin: 4.5 g/dL (ref 3.6–5.1)
Alkaline phosphatase (APISO): 66 U/L (ref 35–144)
BUN: 9 mg/dL (ref 7–25)
CO2: 22 mmol/L (ref 20–32)
Calcium: 9.4 mg/dL (ref 8.6–10.3)
Chloride: 109 mmol/L (ref 98–110)
Creat: 0.84 mg/dL (ref 0.70–1.30)
Globulin: 2.5 g/dL (calc) (ref 1.9–3.7)
Glucose, Bld: 90 mg/dL (ref 65–99)
Potassium: 4.3 mmol/L (ref 3.5–5.3)
Sodium: 140 mmol/L (ref 135–146)
Total Bilirubin: 0.6 mg/dL (ref 0.2–1.2)
Total Protein: 7 g/dL (ref 6.1–8.1)

## 2020-12-27 LAB — THYROID PANEL WITH TSH
Free Thyroxine Index: 2.3 (ref 1.4–3.8)
T3 Uptake: 28 % (ref 22–35)
T4, Total: 8.2 ug/dL (ref 4.9–10.5)
TSH: 1.35 mIU/L (ref 0.40–4.50)

## 2020-12-27 LAB — LIPID PANEL
Cholesterol: 156 mg/dL (ref <200)
HDL: 49 mg/dL (ref >=40)
LDL Cholesterol (Calc): 88 mg/dL (calc)
Non-HDL Cholesterol (Calc): 107 mg/dL (calc) (ref <130)
Total CHOL/HDL Ratio: 3.2 (calc) (ref <5.0)
Triglycerides: 91 mg/dL (ref <150)

## 2020-12-27 LAB — URIC ACID: Uric Acid, Serum: 7.7 mg/dL (ref 4.0–8.0)

## 2020-12-27 LAB — PSA: PSA: 0.36 ng/mL (ref <=4.00)

## 2020-12-27 LAB — HEMOGLOBIN A1C
Hgb A1c MFr Bld: 5.6 % of total Hgb (ref <5.7)
Mean Plasma Glucose: 114 mg/dL
eAG (mmol/L): 6.3 mmol/L

## 2020-12-27 LAB — HIGH SENSITIVITY CRP: hs-CRP: 5.5 mg/L — ABNORMAL HIGH

## 2020-12-27 NOTE — Telephone Encounter (Signed)
Labs are notable for the following:  Lipid panel looks good.  CBC and metabolic panel look good.  C-reactive protein, inflammation marker, is elevated at 5.5.  Inflammation is a risk factor for cardiovascular disease.  The reason for this elevation is unclear.  In general it is beneficial to exercise regularly and to minimize dietary intake of processed carbohydrates including breads, pastas, cereals, sugars and sweets.  I suggest rechecking this in about 6 months.  Thyroid function and PSA look perfect.

## 2020-12-27 NOTE — Telephone Encounter (Signed)
Placed in the mail to the patient's home address.

## 2020-12-31 ENCOUNTER — Ambulatory Visit: Payer: Self-pay | Admitting: Family Medicine

## 2021-01-26 ENCOUNTER — Other Ambulatory Visit: Payer: Self-pay

## 2021-01-26 ENCOUNTER — Emergency Department (HOSPITAL_COMMUNITY)
Admission: EM | Admit: 2021-01-26 | Discharge: 2021-01-26 | Disposition: A | Discharge status: Home / Self Care | Payer: 59 | Attending: Emergency Medicine | Admitting: Emergency Medicine

## 2021-01-26 ENCOUNTER — Emergency Department (HOSPITAL_COMMUNITY): Payer: 59

## 2021-01-26 ENCOUNTER — Encounter (HOSPITAL_COMMUNITY): Payer: Self-pay | Admitting: Emergency Medicine

## 2021-01-26 DIAGNOSIS — K2970 Gastritis, unspecified, without bleeding: Secondary | ICD-10-CM | POA: Diagnosis not present

## 2021-01-26 DIAGNOSIS — Z20822 Contact with and (suspected) exposure to covid-19: Secondary | ICD-10-CM | POA: Diagnosis not present

## 2021-01-26 DIAGNOSIS — Z716 Tobacco abuse counseling: Secondary | ICD-10-CM | POA: Insufficient documentation

## 2021-01-26 DIAGNOSIS — R911 Solitary pulmonary nodule: Secondary | ICD-10-CM | POA: Diagnosis not present

## 2021-01-26 DIAGNOSIS — F17210 Nicotine dependence, cigarettes, uncomplicated: Secondary | ICD-10-CM | POA: Diagnosis not present

## 2021-01-26 DIAGNOSIS — R109 Unspecified abdominal pain: Secondary | ICD-10-CM

## 2021-01-26 LAB — URINALYSIS, ROUTINE W REFLEX MICROSCOPIC
Bilirubin Urine: NEGATIVE
Glucose, UA: NEGATIVE mg/dL
Hgb urine dipstick: NEGATIVE
Ketones, ur: NEGATIVE mg/dL
Leukocytes,Ua: NEGATIVE
Nitrite: NEGATIVE
Protein, ur: NEGATIVE mg/dL
Specific Gravity, Urine: 1.02 (ref 1.005–1.030)
pH: 6 (ref 5.0–8.0)

## 2021-01-26 LAB — TROPONIN I (HIGH SENSITIVITY): Troponin I (High Sensitivity): <2 ng/L (ref <18)

## 2021-01-26 LAB — CBC WITH DIFFERENTIAL/PLATELET
Abs Immature Granulocytes: 0.03 10*3/uL (ref 0.00–0.07)
Basophils Absolute: 0 10*3/uL (ref 0.0–0.1)
Basophils Relative: 0 %
Eosinophils Absolute: 0.2 10*3/uL (ref 0.0–0.5)
Eosinophils Relative: 3 %
HCT: 46.4 % (ref 39.0–52.0)
Hemoglobin: 15.4 g/dL (ref 13.0–17.0)
Immature Granulocytes: 0 %
Lymphocytes Relative: 25 %
Lymphs Abs: 2 10*3/uL (ref 0.7–4.0)
MCH: 31.4 pg (ref 26.0–34.0)
MCHC: 33.2 g/dL (ref 30.0–36.0)
MCV: 94.7 fL (ref 80.0–100.0)
Monocytes Absolute: 0.7 10*3/uL (ref 0.1–1.0)
Monocytes Relative: 9 %
Neutro Abs: 4.8 10*3/uL (ref 1.7–7.7)
Neutrophils Relative %: 63 %
Platelets: 267 10*3/uL (ref 150–400)
RBC: 4.9 MIL/uL (ref 4.22–5.81)
RDW: 13.5 % (ref 11.5–15.5)
WBC: 7.7 10*3/uL (ref 4.0–10.5)
nRBC: 0 % (ref 0.0–0.2)

## 2021-01-26 LAB — COMPREHENSIVE METABOLIC PANEL
ALT: 20 U/L (ref 0–44)
AST: 16 U/L (ref 15–41)
Albumin: 3.8 g/dL (ref 3.5–5.0)
Alkaline Phosphatase: 56 U/L (ref 38–126)
Anion gap: 8 (ref 5–15)
BUN: 17 mg/dL (ref 6–20)
CO2: 24 mmol/L (ref 22–32)
Calcium: 9.4 mg/dL (ref 8.9–10.3)
Chloride: 110 mmol/L (ref 98–111)
Creatinine, Ser: 0.8 mg/dL (ref 0.61–1.24)
GFR, Estimated: >60 mL/min (ref >60)
Glucose, Bld: 107 mg/dL — ABNORMAL HIGH (ref 70–99)
Potassium: 3.8 mmol/L (ref 3.5–5.1)
Sodium: 142 mmol/L (ref 135–145)
Total Bilirubin: 0.4 mg/dL (ref 0.3–1.2)
Total Protein: 6.8 g/dL (ref 6.5–8.1)

## 2021-01-26 LAB — PROTIME-INR
INR: 1 (ref 0.8–1.2)
Prothrombin Time: 12.7 seconds (ref 11.4–15.2)

## 2021-01-26 LAB — RESP PANEL BY RT-PCR (FLU A&B, COVID) ARPGX2
Influenza A by PCR: NEGATIVE
Influenza B by PCR: NEGATIVE
SARS Coronavirus 2 by RT PCR: NEGATIVE

## 2021-01-26 LAB — TYPE AND SCREEN
ABO/RH(D): A POS
Antibody Screen: NEGATIVE

## 2021-01-26 LAB — LIPASE, BLOOD: Lipase: 26 U/L (ref 11–51)

## 2021-01-26 MED ORDER — SODIUM CHLORIDE 0.9 % IV BOLUS
1000.0000 mL | Freq: Once | INTRAVENOUS | Status: AC
  Administered 2021-01-26: 1000 mL via INTRAVENOUS

## 2021-01-26 MED ORDER — IOHEXOL 350 MG/ML SOLN
100.0000 mL | Freq: Once | INTRAVENOUS | Status: AC | PRN
  Administered 2021-01-26: 75 mL via INTRAVENOUS

## 2021-01-26 MED ORDER — LIDOCAINE VISCOUS HCL 2 % MT SOLN
15.0000 mL | Freq: Once | OROMUCOSAL | Status: AC
  Administered 2021-01-26: 15 mL via ORAL
  Filled 2021-01-26: qty 15

## 2021-01-26 MED ORDER — SUCRALFATE 1 G PO TABS: 1.0000 g | ORAL_TABLET | Freq: Three times a day (TID) | ORAL | 0 refills | Status: AC

## 2021-01-26 MED ORDER — PANTOPRAZOLE 80MG IVPB - SIMPLE MED
80.0000 mg | Freq: Once | INTRAVENOUS | Status: AC
  Administered 2021-01-26: 80 mg via INTRAVENOUS
  Filled 2021-01-26: qty 80

## 2021-01-26 MED ORDER — ALUM & MAG HYDROXIDE-SIMETH 200-200-20 MG/5ML PO SUSP
30.0000 mL | Freq: Once | ORAL | Status: AC
  Administered 2021-01-26: 30 mL via ORAL
  Filled 2021-01-26: qty 30

## 2021-01-26 MED ORDER — ONDANSETRON HCL 4 MG/2ML IJ SOLN
4.0000 mg | Freq: Once | INTRAMUSCULAR | Status: AC
  Administered 2021-01-26: 4 mg via INTRAVENOUS
  Filled 2021-01-26: qty 2

## 2021-01-26 MED ORDER — ONDANSETRON 4 MG PO TBDP: 4.0000 mg | ORAL_TABLET | ORAL | 0 refills | Status: AC | PRN

## 2021-01-26 MED ORDER — OMEPRAZOLE 20 MG PO CPDR: 20.0000 mg | DELAYED_RELEASE_CAPSULE | Freq: Every day | ORAL | 0 refills | Status: AC

## 2021-01-26 NOTE — ED Triage Notes (Signed)
States that he has chest, abdominal and throat pressure all day today, vomited today and it was all dark red blood. States he had two beer last night. Uses NSAIDs occasionally.

## 2021-01-26 NOTE — ED Provider Notes (Signed)
[facility]-EMERGENCY DEPT Provider Note   CSN: [id number] Arrival date & time: 01/26/21  1725     History Chief Complaint  Patient presents with   Hematemesis   Abdominal Pain    Ronald Carter is a 55 y.o. male.  55 year old male with history as below presented to ER secondary to abdominal pain, nausea and vomiting.  Patient reports approximately 5 hours prior to arrival he had an episode of vomiting with a small amount of blood mixed with it.  Dark red blood.  No emesis since then.  He is having abdominal pain that is epigastric, radiating to his left shoulder.  Abdominal pain described as aching, cramping.  Port similar symptoms in the past was diagnosed with peptic ulcer disease, he was on PPI for multiple years but is no longer taking it.  Last endoscopy was approximate 15 years ago.  Patient is intermittent alcohol drinker, no history of esophageal varices.  No daily alcohol use.  No daily NSAID use.  No recent travel, sick contacts or suspicious oral intake.  No fevers or chills.  No medications prior to arrival  Feels nausea right now but no further episodes of emesis.  The history is provided by the patient. No language interpreter was used.  Abdominal Pain Associated symptoms: nausea and vomiting   Associated symptoms: no chest pain, no chills, no cough, no dysuria, no fever, no hematuria, no shortness of breath and no sore throat       Past Medical History:  Diagnosis Date   Anxiety    Chronic back pain    Chronic knee pain    left   Depression    GERD (gastroesophageal reflux disease)     Patient Active Problem List   Diagnosis Date Noted   History of gout 07/15/2020   Class 2 obesity due to excess calories without serious comorbidity with body mass index (BMI) of 35.0 to 35.9 in adult 11/16/2018   Urinary hesitancy 05/02/2018   Tobacco use disorder 01/24/2018   Healthcare maintenance 01/24/2018   Anxiety 01/24/2018   Depression 01/24/2018     Past Surgical History:  Procedure Laterality Date   BACK SURGERY     KNEE SURGERY         Family History  Problem Relation Age of Onset   COPD Mother    Diabetes Father    Heart disease Father    Heart attack Father    Fibromyalgia Sister    Healthy Sister    Healthy Sister    Hypertension Brother    Hypertension Brother    Hypertension Brother    Hypertension Brother    Hypertension Brother    Cancer Maternal Grandmother    Stomach cancer Maternal Grandmother    Prostate cancer Other     Social History   Tobacco Use   Smoking status: Every Day    Packs/day: 0.50    Years: 25.00    Pack years: 12.50    Types: Cigarettes   Smokeless tobacco: Never  Substance Use Topics   Alcohol use: Not Currently   Drug use: No    Home Medications Prior to Admission medications   Medication Sig Start Date End Date Taking? Authorizing Provider  Colchicine (MITIGARE) 0.6 MG CAPS Take 1 capsule by mouth 2 (two) times daily as needed. Patient taking differently: Take 1 capsule by mouth 2 (two) times daily as needed (gout). 12/18/20  Yes Hilts, Casimiro NeedleMichael, MD  HYDROcodone-acetaminophen (NORCO/VICODIN) 5-325 MG tablet Take 1 tablet  by mouth at bedtime as needed for moderate pain or severe pain. 12/18/20  Yes Hilts, Casimiro Needle, MD  omeprazole (PRILOSEC) 20 MG capsule Take 1 capsule (20 mg total) by mouth daily for 14 days. 01/26/21 02/09/21 Yes Tanda Rockers A, DO  ondansetron (ZOFRAN ODT) 4 MG disintegrating tablet Take 1 tablet (4 mg total) by mouth every 4 (four) hours as needed for nausea or vomiting. 01/26/21  Yes Tanda Rockers A, DO  phentermine (ADIPEX-P) 37.5 MG tablet TAKE 1 TABLET BY MOUTH ONCE DAILY BEFORE BREAKFAST Patient taking differently: Take 37.5 mg by mouth daily before breakfast. 10/09/20  Yes Hilts, Casimiro Needle, MD  sucralfate (CARAFATE) 1 g tablet Take 1 tablet (1 g total) by mouth 4 (four) times daily -  with meals and at bedtime for 7 days. 01/26/21 02/02/21 Yes Sloan Leiter, DO     Allergies    Patient has no known allergies.  Review of Systems   Review of Systems  Constitutional:  Negative for chills and fever.  HENT:  Negative for ear pain and sore throat.   Eyes:  Negative for pain and visual disturbance.  Respiratory:  Negative for cough and shortness of breath.   Cardiovascular:  Negative for chest pain and palpitations.  Gastrointestinal:  Positive for abdominal pain, nausea and vomiting.  Genitourinary:  Negative for dysuria and hematuria.  Musculoskeletal:  Negative for arthralgias and back pain.  Skin:  Negative for color change and rash.  Neurological:  Negative for seizures and syncope.  All other systems reviewed and are negative.  Physical Exam Updated Vital Signs BP 114/82 (BP Location: Right Arm)   Pulse 77   Temp 99.2 F (37.3 C) (Oral)   Resp 15   Ht 5\' 9"  (1.753 m)   Wt 108 kg   SpO2 99%   BMI 35.15 kg/m   Physical Exam Vitals and nursing note reviewed.  Constitutional:      General: He is not in acute distress.    Appearance: He is well-developed.  HENT:     Head: Normocephalic and atraumatic.     Right Ear: External ear normal.     Left Ear: External ear normal.     Mouth/Throat:     Mouth: Mucous membranes are moist.  Eyes:     General: No scleral icterus. Cardiovascular:     Rate and Rhythm: Normal rate and regular rhythm.     Pulses: Normal pulses.          Radial pulses are 2+ on the right side and 2+ on the left side.       Posterior tibial pulses are 2+ on the right side and 2+ on the left side.     Heart sounds: Normal heart sounds.  Pulmonary:     Effort: Pulmonary effort is normal. No respiratory distress.     Breath sounds: Normal breath sounds.  Abdominal:     General: Abdomen is flat.     Palpations: Abdomen is soft.     Tenderness: There is abdominal tenderness in the epigastric area. There is no right CVA tenderness, left CVA tenderness, guarding or rebound.  Musculoskeletal:        General: Normal  range of motion.     Cervical back: Normal range of motion.     Right lower leg: No edema.     Left lower leg: No edema.  Skin:    General: Skin is warm and dry.     Capillary Refill: Capillary refill takes less than  2 seconds.  Neurological:     Mental Status: He is alert and oriented to person, place, and time.  Psychiatric:        Mood and Affect: Mood normal.        Behavior: Behavior normal.    ED Results / Procedures / Treatments   Labs (all labs ordered are listed, but only abnormal results are displayed) Labs Reviewed  COMPREHENSIVE METABOLIC PANEL - Abnormal; Notable for the following components:      Result Value   Glucose, Bld 107 (*)    All other components within normal limits  URINALYSIS, ROUTINE W REFLEX MICROSCOPIC - Abnormal; Notable for the following components:   APPearance CLOUDY (*)    All other components within normal limits  RESP PANEL BY RT-PCR (FLU A&B, COVID) ARPGX2  CBC WITH DIFFERENTIAL/PLATELET  PROTIME-INR  LIPASE, BLOOD  TYPE AND SCREEN  TROPONIN I (HIGH SENSITIVITY)    EKG EKG Interpretation  Date/Time:  Sunday January 26 2021 18:48:52 EDT Ventricular Rate:  75 PR Interval:  139 QRS Duration: 89 QT Interval:  389 QTC Calculation: 435 R Axis:   -12 Text Interpretation: Sinus rhythm Similar to prior tracing No STEMI Confirmed by Tanda Rockers ([id number]) on 01/26/2021 7:00:39 PM  Radiology CT ABDOMEN PELVIS W CONTRAST  Result Date: 01/26/2021 CLINICAL DATA:  Acute abdominal pain. EXAM: CT ABDOMEN AND PELVIS WITH CONTRAST TECHNIQUE: Multidetector CT imaging of the abdomen and pelvis was performed using the standard protocol following bolus administration of intravenous contrast. CONTRAST:  74mL OMNIPAQUE IOHEXOL 350 MG/ML SOLN COMPARISON:  None. FINDINGS: Lower chest: There is a cluster of small nodules in the lateral right lung base with the largest nodule measuring approximately 1 cm (25/4). No intra-abdominal free air or free fluid.  Hepatobiliary: No focal liver abnormality is seen. No gallstones, gallbladder wall thickening, or biliary dilatation. Pancreas: Unremarkable. No pancreatic ductal dilatation or surrounding inflammatory changes. Spleen: Normal in size without focal abnormality. Adrenals/Urinary Tract: Mild bilateral adrenal thickening. The kidneys, visualized ureters, and urinary bladder appear unremarkable. Stomach/Bowel: Small hiatal hernia. There is no bowel obstruction or active inflammation. The appendix is normal. Vascular/Lymphatic: Mild aortoiliac atherosclerotic disease. The IVC is unremarkable no portal venous gas. There is no adenopathy. Reproductive: The prostate and seminal vesicles are grossly unremarkable. No pelvic mass. Other: None Musculoskeletal: Degenerative changes of the spine. L5 laminectomy. L5-S1 disc spacer and posterior fusion and grade 1 anterolisthesis. No acute osseous pathology. IMPRESSION: 1. No acute intra-abdominal or pelvic pathology. No bowel obstruction. Normal appendix. 2. Cluster of small nodules in the lateral right lung base, likely infectious or inflammatory in etiology. Non-contrast chest CT at 3-6 months is recommended. If the nodules are stable at time of repeat CT, then future CT at 18-24 months (from today's scan) is considered optional for low-risk patients, but is recommended for high-risk patients. This recommendation follows the consensus statement: Guidelines for Management of Incidental Pulmonary Nodules Detected on CT Images: From the Fleischner Society 2017; Radiology 2017; 284:228-243. 3. Aortic Atherosclerosis (ICD10-I70.0). Electronically Signed   By: Elgie Collard M.D.   On: 01/26/2021 22:37   DG Chest Portable 1 View  Result Date: 01/26/2021 CLINICAL DATA:  Epigastric pain EXAM: PORTABLE CHEST 1 VIEW COMPARISON:  07/23/2010 FINDINGS: Lungs are clear.  No pleural effusion or pneumothorax. The heart is normal in size. IMPRESSION: No evidence of acute cardiopulmonary  disease. Electronically Signed   By: Charline Bills M.D.   On: 01/26/2021 19:56    Procedures Procedures   Medications  Ordered in ED Medications  sodium chloride 0.9 % bolus 1,000 mL (0 mLs Intravenous Stopped 01/26/21 2309)  pantoprazole (PROTONIX) 80 mg /NS 100 mL IVPB (0 mg Intravenous Stopped 01/26/21 2045)  ondansetron (ZOFRAN) injection 4 mg (4 mg Intravenous Given 01/26/21 1933)  iohexol (OMNIPAQUE) 350 MG/ML injection 100 mL (75 mLs Intravenous Contrast Given 01/26/21 2200)  alum & mag hydroxide-simeth (MAALOX/MYLANTA) 200-200-20 MG/5ML suspension 30 mL (30 mLs Oral Given 01/26/21 2309)    And  lidocaine (XYLOCAINE) 2 % viscous mouth solution 15 mL (15 mLs Oral Given 01/26/21 2309)    ED Course  I have reviewed the triage vital signs and the nursing notes.  Pertinent labs & imaging results that were available during my care of the patient were reviewed by me and considered in my medical decision making (see chart for details).    MDM Rules/Calculators/A&P                           This patient complains of abdominal pain, emesis; this involves an extensive number of treatment Options and is a complaint that carries with it a high risk of complications and Morbidity.  Abdomen is soft, mildly tender palpation to mid epigastrium.  Equal symmetric pulses in all 4 extremities.  Vital signs reviewed and are stable.  Serious etiologies considered.   ECG Reviewed, no evidence of acute ischemia.  Troponin negative.  Chest x-ray negative.  No ongoing chest pain.  ACS is unlikely.  Labs reviewed and are stable.  I ordered imaging studies which included CXR and CTAP with IV contrast and I independently    visualized and interpreted imaging which showed no acute abnormalities.   Incidental finding of nodule in the right.  Recommend repeat CT and 3-6 months.  No respiratory complaints offered. He does have history of tobacco use. Advised him to stop smoking. This was discussed with the  patient.    Etiology of presenting symptoms likely secondary to gastritis, possible peptic ulcer.  Patient is history this in the past.  Discussed dietary changes with the patient.  PPI x2 weeks.  Follow-up with gastroenterology for repeat endoscopy.  Patient is agreeable.  The patient improved significantly and was discharged in stable condition. Detailed discussions were had with the patient regarding current findings, and need for close f/u with PCP or on call doctor. The patient has been instructed to return immediately if the symptoms worsen in any way for re-evaluation. Patient verbalized understanding and is in agreement with current care plan. All questions answered prior to discharge.    I spent at least 3 minutes discussing the dangers of smoking and encouraged them to quit.     Final Clinical Impression(s) / ED Diagnoses Final diagnoses:  Gastritis, presence of bleeding unspecified, unspecified chronicity, unspecified gastritis type  Abdominal pain, unspecified abdominal location  Lung nodule  Tobacco abuse counseling    Rx / DC Orders ED Discharge Orders          Ordered    sucralfate (CARAFATE) 1 g tablet  3 times daily with meals & bedtime        01/26/21 2255    omeprazole (PRILOSEC) 20 MG capsule  Daily        01/26/21 2255    ondansetron (ZOFRAN ODT) 4 MG disintegrating tablet  Every 4 hours PRN        01/26/21 2256             Tanda Rockers  A, DO 01/26/21 2312

## 2021-01-26 NOTE — Discharge Instructions (Addendum)
Please follow up with GI in next week for evaluation, evaluation for endoscopy.

## 2021-01-26 NOTE — ED Notes (Signed)
Patient transported to CT 

## 2021-01-27 ENCOUNTER — Encounter: Payer: Self-pay | Admitting: Radiology

## 2021-01-27 NOTE — Progress Notes (Signed)
Telephone encounter  Evaluation after Contrast Extravasation  Called Patient after contrast extravasation while in CT on 8.28.22.   Per Patient: Exam: There is minimal  swelling at the wrist area that is improving.  There is no erythema. There is no discoloration. There are no blisters. There are no signs of decreased perfusion of the skin.  It is no warm to touch.  The patient has full ROM in fingers.    Per contrast extravasation protocol, I have instructed the patient to keep an ice pack on the area for 20-60 minutes at a time for about 48 hours.   Keep arm elevated as much as possible.   The patient understands to call the radiology department if there is: - increase in pain or swelling - changed or altered sensation - ulceration or blistering - increasing redness - warmth or increasing firmness - decreased tissue perfusion as noted by decreased capillary refill or discoloration of skin - decreased pulses peripheral to site   Alene Mires PA-C 01/27/2021 3:26 PM

## 2021-05-19 ENCOUNTER — Ambulatory Visit: Payer: 59 | Admitting: Orthopedic Surgery

## 2021-07-09 ENCOUNTER — Ambulatory Visit (INDEPENDENT_AMBULATORY_CARE_PROVIDER_SITE_OTHER): Payer: 59 | Admitting: Orthopedic Surgery

## 2021-07-09 ENCOUNTER — Encounter: Payer: Self-pay | Admitting: Orthopedic Surgery

## 2021-07-09 ENCOUNTER — Ambulatory Visit (INDEPENDENT_AMBULATORY_CARE_PROVIDER_SITE_OTHER): Payer: 59

## 2021-07-09 ENCOUNTER — Ambulatory Visit: Payer: Self-pay

## 2021-07-09 ENCOUNTER — Other Ambulatory Visit: Payer: Self-pay

## 2021-07-09 ENCOUNTER — Telehealth: Payer: Self-pay

## 2021-07-09 VITALS — Ht 70.0 in | Wt 222.0 lb

## 2021-07-09 DIAGNOSIS — M25561 Pain in right knee: Secondary | ICD-10-CM

## 2021-07-09 DIAGNOSIS — M25562 Pain in left knee: Secondary | ICD-10-CM | POA: Diagnosis not present

## 2021-07-09 NOTE — Progress Notes (Signed)
Office Visit Note   Patient: Ronald Carter           Date of Birth: 06-08-1965           MRN: [id number] Visit Date: 07/09/2021 Requested by: No referring provider defined for this encounter. PCP: Pcp, No  Subjective: Chief Complaint  Patient presents with   Right Knee - Pain   Left Knee - Pain    HPI: Ronald Carter is a 56 year old patient with bilateral knee pain left worse than right.  He has lost 40 pounds in the past 3 years.  He reports relatively sudden onset of pain in the left knee.  He does have a history of gout which primarily affects his right great toe.  He has lost weight by drinking water and not eating processed food.  Cortisone shots have stopped working in his knees.  He has a history of 3 left knee arthroscopies.  Hard for him to walk down the hill.  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  He also has a history of back surgery.  He is not taking any Motrin or Norco at this time.              ROS: All systems reviewed are negative as they relate to the chief complaint within the history of present illness.  Patient denies  fevers or chills.   Assessment & Plan: Visit Diagnoses:  1. Pain in both knees, unspecified chronicity     Plan: Impression is fairly severe bilateral knee arthritis with some mild atrophy in the leg on the left compared to the right likely related to his prior back surgery.  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  He is heading for knee replacement at sometime in the future but wants to try now gel injections which have helped him in the past.  We will get him set up for that in about 2 to 3 weeks.  Follow-up for those injections.  Follow-Up Instructions: Return in about 4 weeks (around 08/06/2021).   Orders:  Orders Placed This Encounter  Procedures   XR Knee 1-2 Views Right   XR KNEE 3 VIEW LEFT   No orders of the defined types were placed in this  encounter.     Procedures: No procedures performed   Clinical Data: No additional findings.  Objective: Vital Signs: Ht 5\' 10"  (1.778 m)    Wt 222 lb (100.7 kg)    BMI 31.85 kg/m   Physical Exam:   Constitutional: Patient appears well-developed HEENT:  Head: Normocephalic Eyes:EOM are normal Neck: Normal range of motion Cardiovascular: Normal rate Pulmonary/chest: Effort normal Neurologic: Patient is alert Skin: Skin is warm Psychiatric: Patient has normal mood and affect   Ortho Exam: Ortho exam demonstrates moderate effusion in the left knee no effusion in the right knee.  Extensor mechanism intact.  Varus alignment present.  Patient has 5 out of 5 ankle dorsiflexion plantarflexion quad hamstring strength with good hip flexion abduction adduction strength bilaterally.  Mild muscle atrophy left leg versus right by about a centimeter in the calf and centimeter half in the thigh.  No warmth left knee versus right.  We discussed aspiration and injection of the left knee today for possibility of gout but he wants to wait on the gel injections. This patient is diagnosed with osteoarthritis of the knee(s).    Radiographs show evidence of  joint space narrowing, osteophytes, subchondral sclerosis and/or subchondral cysts.  This patient has knee pain which interferes with functional and activities of daily living.    This patient has experienced inadequate response, adverse effects and/or intolerance with conservative treatments such as acetaminophen, NSAIDS, topical creams, physical therapy or regular exercise, knee bracing and/or weight loss.   This patient has experienced inadequate response or has a contraindication to intra articular steroid injections for at least 3 months.   This patient is not scheduled to have a total knee replacement within 6 months of starting treatment with viscosupplementation.   Specialty Comments:  No specialty comments available.  Imaging: XR Knee  1-2 Views Right  Result Date: 07/09/2021 [facility] lateral merchant radiographs right knee reviewed.  Mild to moderate varus alignment present.  Severe end-stage tricompartmental arthritis also present affecting the medial and patellofemoral compartment the most.  No acute fracture.  Patella height normal relative to distal femur  XR KNEE 3 VIEW LEFT  Result Date: 07/09/2021 [facility] lateral [facility] radiographs left knee reviewed.  Severe end-stage tricompartmental arthritis is present particularly in the medial compartment with joint space obliteration and sclerosis present.  Mild to moderate varus alignment present.  No acute fracture.  Patella height normal relative to distal femur.    PMFS History: Patient Active Problem List   Diagnosis Date Noted   History of gout 07/15/2020   Class 2 obesity due to excess calories without serious comorbidity with body mass index (BMI) of 35.0 to 35.9 in adult 11/16/2018   Urinary hesitancy 05/02/2018   Tobacco use disorder 01/24/2018   Healthcare maintenance 01/24/2018   Anxiety 01/24/2018   Depression 01/24/2018   Past Medical History:  Diagnosis Date   Anxiety    Chronic back pain    Chronic knee pain    left   Depression    GERD (gastroesophageal reflux disease)     Family History  Problem Relation Age of Onset   COPD Mother    Diabetes Father    Heart disease Father    Heart attack Father    Fibromyalgia Sister    Healthy Sister    Healthy Sister    Hypertension Brother    Hypertension Brother    Hypertension Brother    Hypertension Brother    Hypertension Brother    Cancer Maternal Grandmother    Stomach cancer Maternal Grandmother    Prostate cancer Other     Past Surgical History:  Procedure Laterality Date   BACK SURGERY     KNEE SURGERY     Social History   Occupational History   Not on file  Tobacco Use   Smoking status: Every Day    Packs/day: 0.50    Years: 25.00    Pack years: 12.50    Types: Cigarettes   Smokeless  tobacco: Never  Substance and Sexual Activity   Alcohol use: Not Currently   Drug use: No   Sexual activity: Yes    Birth control/protection: None

## 2021-07-09 NOTE — Telephone Encounter (Signed)
Noted  

## 2021-07-09 NOTE — Telephone Encounter (Signed)
Auth needed for bilat knee gel injections  

## 2021-08-05 NOTE — Telephone Encounter (Signed)
Talked with patient concerning gel injection.  

## 2021-08-05 NOTE — Telephone Encounter (Signed)
Pt called for an update.  ? ?CB [phone] ?

## 2021-08-07 ENCOUNTER — Telehealth: Payer: Self-pay

## 2021-08-07 NOTE — Telephone Encounter (Signed)
PA required for Monovisc, bilateral knee. ?Faxed completed PA form to [place] at [phone]. ?BV Pending ?

## 2021-08-14 ENCOUNTER — Telehealth: Payer: Self-pay

## 2021-08-14 NOTE — Telephone Encounter (Signed)
Called and left a VM advising patient that PA for Monovisc was denied due to not being covered under his plan.  Advised patient to [facility] with any concerns. ?

## 2022-01-13 ENCOUNTER — Ambulatory Visit (INDEPENDENT_AMBULATORY_CARE_PROVIDER_SITE_OTHER): Payer: 59 | Admitting: Orthopedic Surgery

## 2022-01-13 ENCOUNTER — Telehealth: Payer: Self-pay | Admitting: Orthopedic Surgery

## 2022-01-13 ENCOUNTER — Ambulatory Visit (INDEPENDENT_AMBULATORY_CARE_PROVIDER_SITE_OTHER): Payer: 59

## 2022-01-13 DIAGNOSIS — M25562 Pain in left knee: Secondary | ICD-10-CM

## 2022-01-13 DIAGNOSIS — M170 Bilateral primary osteoarthritis of knee: Secondary | ICD-10-CM

## 2022-01-13 MED ORDER — ACETAMINOPHEN-CODEINE 300-30 MG PO TABS: 1.0000 | ORAL_TABLET | Freq: Three times a day (TID) | ORAL | 0 refills | Status: AC | PRN

## 2022-01-13 NOTE — Telephone Encounter (Signed)
Scheduled appt for patient as work in to see Ronald Carter this afternoon. Unable to provide medication without treatment or evaluation. Not seen since 07/2021

## 2022-01-13 NOTE — Telephone Encounter (Signed)
Pt called and set an appt. Pt states he was in a tree and knee got caught and knee is in pain and asking for pain medication until appt. Pt phone number is [phone].

## 2022-01-14 ENCOUNTER — Encounter: Payer: Self-pay | Admitting: Orthopedic Surgery

## 2022-01-14 MED ORDER — METHYLPREDNISOLONE ACETATE 40 MG/ML IJ SUSP
40.0000 mg | INTRAMUSCULAR | Status: AC | PRN
  Administered 2022-01-13: 40 mg via INTRA_ARTICULAR

## 2022-01-14 MED ORDER — LIDOCAINE HCL 1 % IJ SOLN
5.0000 mL | INTRAMUSCULAR | Status: AC | PRN
  Administered 2022-01-13: 5 mL

## 2022-01-14 MED ORDER — BUPIVACAINE HCL 0.25 % IJ SOLN
4.0000 mL | INTRAMUSCULAR | Status: AC | PRN
  Administered 2022-01-13: 4 mL via INTRA_ARTICULAR

## 2022-01-14 NOTE — Progress Notes (Signed)
Office Visit Note   Patient: Ronald Carter           Date of Birth: [birth year]           MRN: [id number] Visit Date: 01/13/2022 Requested by: No referring provider defined for this encounter. PCP: Pcp, No  Subjective: Chief Complaint  Patient presents with   Left Knee - Pain    HPI: Ronald Carter is a 56 year old patient with left knee pain.  Had a twisting injury 01/12/2022.  Has known history of left knee arthritis.  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  Has been having some occasional popping in that knee.  He has lost 46 pounds since last office visit.  Right knee doing reasonably well.  Using ibuprofen and Goody's powders.              ROS: All systems reviewed are negative as they relate to the chief complaint within the history of present illness.  Patient denies  fevers or chills.   Assessment & Plan: Visit Diagnoses:  1. Left knee pain, unspecified chronicity     Plan: Impression is progressive left knee arthritis and varus deformity.  Aspiration and injection performed today.  One-time prescription for Tylenol 3.  Preapproved gel injection both knees.  That may or may not be approved based on his insurance. This patient is diagnosed with osteoarthritis of the knee(s).    Radiographs show evidence of joint space narrowing, osteophytes, subchondral sclerosis and/or subchondral cysts.  This patient has knee pain which interferes with functional and activities of daily living.    This patient has experienced inadequate response, adverse effects and/or intolerance with conservative treatments such as acetaminophen, NSAIDS, topical creams, physical therapy or regular exercise, knee bracing and/or weight loss.   This patient has experienced inadequate response or has a contraindication to intra articular steroid injections for at least 3 months.   This patient is not scheduled to have a total knee replacement within 6 months of starting treatment with  viscosupplementation.   Follow-Up Instructions: Return if symptoms worsen or fail to improve.   Orders:  Orders Placed This Encounter  Procedures   XR KNEE 3 VIEW LEFT   Meds ordered this encounter  Medications   acetaminophen-codeine (TYLENOL #3) 300-30 MG tablet    Sig: Take 1 tablet by mouth every 8 (eight) hours as needed for moderate pain.    Dispense:  30 tablet    Refill:  0      Procedures: Large Joint Inj: L knee on 01/13/2022 6:35 AM Indications: diagnostic evaluation, joint swelling and pain Details: 18 G 1.5 in needle, superolateral approach  Arthrogram: No  Medications: 5 mL lidocaine 1 %; 40 mg methylPREDNISolone acetate 40 MG/ML; 4 mL bupivacaine 0.25 % Outcome: tolerated well, no immediate complications Procedure, treatment alternatives, risks and benefits explained, specific risks discussed. Consent was given by the patient. Immediately prior to procedure a time out was called to verify the correct patient, procedure, equipment, support staff and site/side marked as required. Patient was prepped and draped in the usual sterile fashion.       Clinical Data: No additional findings.  Objective: Vital Signs: There were no vitals taken for this visit.  Physical Exam:   Constitutional: Patient appears well-developed HEENT:  Head: Normocephalic Eyes:EOM are normal Neck: Normal range of motion Cardiovascular: Normal rate Pulmonary/chest: Effort normal Neurologic: Patient is alert Skin: Skin is warm Psychiatric: Patient has normal mood and affect   Ortho Exam:  Ortho exam demonstrates varus alignment left knee with palpable pedal pulses.  Range of motion is still about 5-1 20.  Collateral cruciate ligaments are stable.  Extensor mechanism intact.  No groin pain on the left with internal and external rotation of the leg.  No other masses lymphadenopathy or skin changes noted in that left knee region.  Has medial greater than lateral joint line tenderness.   Moderate effusions present.  Specialty Comments:  No specialty comments available.  Imaging: XR KNEE 3 VIEW LEFT  Result Date: 01/14/2022 AP lateral merchant radiographs left knee reviewed.  Varus alignment is present.  End-stage tricompartmental osteoarthritis is also present worse in the medial compartment with bone-on-bone changes sclerosis and osteophyte formation present in this location.  No acute fracture.  Patella height normal relative to distal femur    PMFS History: Patient Active Problem List   Diagnosis Date Noted   History of gout 07/15/2020   Class 2 obesity due to excess calories without serious comorbidity with body mass index (BMI) of 35.0 to 35.9 in adult 11/16/2018   Urinary hesitancy 05/02/2018   Tobacco use disorder 01/24/2018   Healthcare maintenance 01/24/2018   Anxiety 01/24/2018   Depression 01/24/2018   Past Medical History:  Diagnosis Date   Anxiety    Chronic back pain    Chronic knee pain    left   Depression    GERD (gastroesophageal reflux disease)     Family History  Problem Relation Age of Onset   COPD Mother    Diabetes Father    Heart disease Father    Heart attack Father    Fibromyalgia Sister    Healthy Sister    Healthy Sister    Hypertension Brother    Hypertension Brother    Hypertension Brother    Hypertension Brother    Hypertension Brother    Cancer Maternal Grandmother    Stomach cancer Maternal Grandmother    Prostate cancer Other     Past Surgical History:  Procedure Laterality Date   BACK SURGERY     KNEE SURGERY     Social History   Occupational History   Not on file  Tobacco Use   Smoking status: Every Day    Packs/day: 0.50    Years: 25.00    Total pack years: 12.50    Types: Cigarettes   Smokeless tobacco: Never  Substance and Sexual Activity   Alcohol use: Not Currently   Drug use: No   Sexual activity: Yes    Birth control/protection: None

## 2022-01-14 NOTE — Telephone Encounter (Signed)
Patient needs auth. To pick up pain medication

## 2022-01-15 NOTE — Progress Notes (Signed)
VOB submitted for Monovisc, bilateral knee  

## 2022-01-16 NOTE — Telephone Encounter (Signed)
Called [facility] pharmacy and confirmed this pt picked up the rx written on 01/13/22 for tylenol #3 yesterday. No auth needed.

## 2022-01-21 DIAGNOSIS — R69 Illness, unspecified: Secondary | ICD-10-CM | POA: Diagnosis not present

## 2022-01-21 DIAGNOSIS — K760 Fatty (change of) liver, not elsewhere classified: Secondary | ICD-10-CM | POA: Diagnosis not present

## 2022-01-23 ENCOUNTER — Telehealth: Payer: Self-pay

## 2022-01-23 NOTE — Telephone Encounter (Signed)
Faxed completed PA to [employer] at [phone] for Monovisc, bilateral knee. PA pending

## 2022-01-27 ENCOUNTER — Telehealth: Payer: Self-pay

## 2022-01-27 NOTE — Telephone Encounter (Addendum)
Called and left a Vm for patient to call back concerning his insurance for gel injection.    Previously [facility] stated that gel injection was not covered and now is stating that they are not able to locate member in [employer].  Resubmitted PA through covermymeds. Pending PA# [id number]

## 2022-01-28 ENCOUNTER — Ambulatory Visit: Payer: 59 | Admitting: Orthopedic Surgery

## 2022-01-28 ENCOUNTER — Other Ambulatory Visit: Payer: Self-pay

## 2022-01-28 ENCOUNTER — Telehealth: Payer: Self-pay | Admitting: Orthopedic Surgery

## 2022-01-28 DIAGNOSIS — M170 Bilateral primary osteoarthritis of knee: Secondary | ICD-10-CM

## 2022-01-28 NOTE — Telephone Encounter (Signed)
Mr. Andrada is returning April's phone call regarding his gel injection authorization.  He can be reached at [phone].

## 2022-01-28 NOTE — Telephone Encounter (Signed)
Talked with patient concerning gel injection approval.  Appt. R/S

## 2022-03-02 DIAGNOSIS — Z13220 Encounter for screening for lipoid disorders: Secondary | ICD-10-CM | POA: Diagnosis not present

## 2022-03-02 DIAGNOSIS — Z125 Encounter for screening for malignant neoplasm of prostate: Secondary | ICD-10-CM | POA: Diagnosis not present

## 2022-03-02 DIAGNOSIS — Z202 Contact with and (suspected) exposure to infections with a predominantly sexual mode of transmission: Secondary | ICD-10-CM | POA: Diagnosis not present

## 2022-03-02 DIAGNOSIS — Z1159 Encounter for screening for other viral diseases: Secondary | ICD-10-CM | POA: Diagnosis not present

## 2022-03-02 DIAGNOSIS — R69 Illness, unspecified: Secondary | ICD-10-CM | POA: Diagnosis not present

## 2022-03-03 ENCOUNTER — Other Ambulatory Visit: Payer: Self-pay | Admitting: Family Medicine

## 2022-03-03 DIAGNOSIS — Z122 Encounter for screening for malignant neoplasm of respiratory organs: Secondary | ICD-10-CM

## 2022-03-23 DIAGNOSIS — E669 Obesity, unspecified: Secondary | ICD-10-CM | POA: Diagnosis not present

## 2022-03-23 DIAGNOSIS — G4719 Other hypersomnia: Secondary | ICD-10-CM | POA: Diagnosis not present

## 2022-03-23 DIAGNOSIS — R69 Illness, unspecified: Secondary | ICD-10-CM | POA: Diagnosis not present

## 2022-03-27 ENCOUNTER — Ambulatory Visit
Admission: RE | Admit: 2022-03-27 | Discharge: 2022-03-27 | Disposition: A | Discharge status: Home / Self Care | Payer: 59 | Source: Ambulatory Visit | Attending: Family Medicine | Admitting: Family Medicine

## 2022-03-27 DIAGNOSIS — Z87891 Personal history of nicotine dependence: Secondary | ICD-10-CM | POA: Diagnosis not present

## 2022-03-27 DIAGNOSIS — Z122 Encounter for screening for malignant neoplasm of respiratory organs: Secondary | ICD-10-CM

## 2022-04-01 ENCOUNTER — Ambulatory Visit: Payer: 59 | Admitting: Orthopedic Surgery

## 2022-04-22 ENCOUNTER — Ambulatory Visit: Payer: 59 | Admitting: Orthopedic Surgery

## 2022-04-22 DIAGNOSIS — M170 Bilateral primary osteoarthritis of knee: Secondary | ICD-10-CM

## 2022-04-25 ENCOUNTER — Encounter: Payer: Self-pay | Admitting: Orthopedic Surgery

## 2022-04-25 DIAGNOSIS — M170 Bilateral primary osteoarthritis of knee: Secondary | ICD-10-CM | POA: Diagnosis not present

## 2022-04-25 MED ORDER — LIDOCAINE HCL 1 % IJ SOLN
5.0000 mL | INTRAMUSCULAR | Status: AC | PRN
  Administered 2022-04-25: 5 mL

## 2022-04-25 MED ORDER — HYDROCODONE-ACETAMINOPHEN 5-325 MG PO TABS: 1.0000 | ORAL_TABLET | Freq: Four times a day (QID) | ORAL | 0 refills | Status: AC | PRN

## 2022-04-25 MED ORDER — HYALURONAN 88 MG/4ML IX SOSY
88.0000 mg | PREFILLED_SYRINGE | INTRA_ARTICULAR | Status: AC | PRN
  Administered 2022-04-25: 88 mg via INTRA_ARTICULAR

## 2022-04-25 NOTE — Progress Notes (Signed)
   Procedure Note  Patient: Ronald Carter             Date of Birth: September 25, 1965           MRN: [id number]             Visit Date: 04/22/2022  Procedures: Visit Diagnoses:  1. Primary osteoarthritis of both knees     Large Joint Inj: bilateral knee on 04/25/2022 2:08 PM Indications: diagnostic evaluation, joint swelling and pain Details: 18 G 1.5 in needle, superolateral approach  Arthrogram: No  Medications (Right): 5 mL lidocaine 1 %; 88 mg Hyaluronan 88 MG/4ML Medications (Left): 5 mL lidocaine 1 %; 88 mg Hyaluronan 88 MG/4ML Outcome: tolerated well, no immediate complications Procedure, treatment alternatives, risks and benefits explained, specific risks discussed. Consent was given by the patient. Immediately prior to procedure a time out was called to verify the correct patient, procedure, equipment, support staff and site/side marked as required. Patient was prepped and draped in the usual sterile fashion.

## 2022-04-27 ENCOUNTER — Telehealth: Payer: Self-pay | Admitting: Orthopedic Surgery

## 2022-04-27 NOTE — Telephone Encounter (Signed)
Patient wants to know  if he [facility]s [facility] said they were supposed to be called in last wed. Best number to reach patient [phone]

## 2022-05-15 ENCOUNTER — Other Ambulatory Visit: Payer: Self-pay | Admitting: Surgical

## 2022-05-15 ENCOUNTER — Ambulatory Visit: Payer: 59 | Admitting: Orthopedic Surgery

## 2022-05-15 ENCOUNTER — Encounter: Payer: Self-pay | Admitting: Orthopedic Surgery

## 2022-05-15 ENCOUNTER — Ambulatory Visit (INDEPENDENT_AMBULATORY_CARE_PROVIDER_SITE_OTHER): Payer: 59

## 2022-05-15 DIAGNOSIS — M25561 Pain in right knee: Secondary | ICD-10-CM

## 2022-05-15 MED ORDER — AMOXICILLIN 500 MG PO TABS: 500.0000 mg | ORAL_TABLET | Freq: Two times a day (BID) | ORAL | 0 refills | Status: AC

## 2022-05-15 MED ORDER — COLCHICINE 0.6 MG PO CAPS: 1.0000 | ORAL_CAPSULE | Freq: Two times a day (BID) | ORAL | 0 refills | Status: DC | PRN

## 2022-05-15 NOTE — Progress Notes (Unsigned)
Office Visit Note   Patient: Ronald Carter           Date of Birth: February 23, 1966           MRN: [id number] Visit Date: 05/15/2022 Requested by: No referring provider defined for this encounter. PCP: Pcp, No  Subjective: Chief Complaint  Patient presents with   Right Knee - Pain    HPI: Ronald Carter is a 56 y.o. male who presents to the office reporting right knee pain.  Localizes pain to the distal/lateral aspect of the right knee.  He states that it has just darted swelling in the last 2 days.  He woke up 2 days ago in the middle the night with severe right knee pain.  Has not really been able to sleep in several days and has difficulty weightbearing due to the amount of pain he is in.  He has difficulty lifting his leg because is too painful.  No history of injury or recent event leading to the onset of pain that he can recall.  He denies any fevers or chills subjectively.  Does have a history of gout and states that this pain intensity feels somewhat similar to that..                ROS: All systems reviewed are negative as they relate to the chief complaint within the history of present illness.  Patient denies fevers or chills.  Assessment & Plan: Visit Diagnoses:  1. Acute pain of right knee     Plan: Patient is a 56 year old male who presents for evaluation of right knee pain.  He awoke in the middle the night 2 days ago with severe pain.  He has focal swelling without fluctuance.  There is some erythema which suggests maybe he has early stages of cellulitis.  No fevers or chills.  Ultrasound examination demonstrates small amount of hyperechoic speckling just deep to the patellar tendon in the area of the swelling which may represent gouty crystals.  No focal fluid collection was identified.  There is also some cobblestone appearance of the subcutaneous tissue overlying the tibial tubercle which may represent early cellulitis.  Plan is to check lab work today including uric acid, CBC  with differential, ESR, CRP.  Prescribed colchicine and amoxicillin to take.  Follow-up on Monday for clinical recheck.  He was instructed to call over the weekend if he has significant worsening of symptoms.  Patient agreed with plan.  Follow-Up Instructions: No follow-ups on file.   Orders:  Orders Placed This Encounter  Procedures   XR KNEE 3 VIEW RIGHT   Uric acid   CBC with Differential   Sed Rate (ESR)   C-reactive protein   Meds ordered this encounter  Medications   amoxicillin (AMOXIL) 500 MG tablet    Sig: Take 1 tablet (500 mg total) by mouth 2 (two) times daily.    Dispense:  14 tablet    Refill:  0   Colchicine (MITIGARE) 0.6 MG CAPS    Sig: Take 1 capsule by mouth 2 (two) times daily as needed (gout).    Dispense:  20 capsule    Refill:  0      Procedures: No procedures performed   Clinical Data: No additional findings.  Objective: Vital Signs: There were no vitals taken for this visit.  Physical Exam:  Constitutional: Patient appears well-developed HEENT:  Head: Normocephalic Eyes:EOM are normal Neck: Normal range of motion Cardiovascular: Normal rate Pulmonary/chest: Effort normal Neurologic:  Patient is alert Skin: Skin is warm Psychiatric: Patient has normal mood and affect  Ortho Exam: Ortho exam demonstrates right knee with trace effusion.  There is a large area of focal soft tissue swelling lateral to the patellar tendon that is very point tender.  There is no fluctuance noted.  There is erythema surrounding this area with some extension of this erythema distally.  Picture attached to chart for viewing.  He has no calf tenderness.  Negative Homans' sign.  Not able to perform straight leg raise due to the severity of his pain.  Does not really have any pain with palpation around the proximal aspect of the knee along the quadricep tendon or patella.  There is some asymmetric warmth of this area compared with the contralateral knee.    Specialty  Comments:  No specialty comments available.  Imaging: No results found.   PMFS History: Patient Active Problem List   Diagnosis Date Noted   History of gout 07/15/2020   Class 2 obesity due to excess calories without serious comorbidity with body mass index (BMI) of 35.0 to 35.9 in adult 11/16/2018   Urinary hesitancy 05/02/2018   Tobacco use disorder 01/24/2018   Healthcare maintenance 01/24/2018   Anxiety 01/24/2018   Depression 01/24/2018   Past Medical History:  Diagnosis Date   Anxiety    Chronic back pain    Chronic knee pain    left   Depression    GERD (gastroesophageal reflux disease)     Family History  Problem Relation Age of Onset   COPD Mother    Diabetes Father    Heart disease Father    Heart attack Father    Fibromyalgia Sister    Healthy Sister    Healthy Sister    Hypertension Brother    Hypertension Brother    Hypertension Brother    Hypertension Brother    Hypertension Brother    Cancer Maternal Grandmother    Stomach cancer Maternal Grandmother    Prostate cancer Other     Past Surgical History:  Procedure Laterality Date   BACK SURGERY     KNEE SURGERY     Social History   Occupational History   Not on file  Tobacco Use   Smoking status: Every Day    Packs/day: 0.50    Years: 25.00    Total pack years: 12.50    Types: Cigarettes   Smokeless tobacco: Never  Substance and Sexual Activity   Alcohol use: Not Currently   Drug use: No   Sexual activity: Yes    Birth control/protection: None

## 2022-05-16 LAB — URIC ACID: Uric Acid, Serum: 6.9 mg/dL (ref 4.0–8.0)

## 2022-05-16 LAB — CBC WITH DIFFERENTIAL/PLATELET
Absolute Monocytes: 718 cells/uL (ref 200–950)
Basophils Absolute: 41 cells/uL (ref 0–200)
Basophils Relative: 0.6 %
Eosinophils Absolute: 159 cells/uL (ref 15–500)
Eosinophils Relative: 2.3 %
HCT: 45.4 % (ref 38.5–50.0)
Hemoglobin: 16 g/dL (ref 13.2–17.1)
Lymphs Abs: 1760 cells/uL (ref 850–3900)
MCH: 31.6 pg (ref 27.0–33.0)
MCHC: 35.2 g/dL (ref 32.0–36.0)
MCV: 89.7 fL (ref 80.0–100.0)
MPV: 11 fL (ref 7.5–12.5)
Monocytes Relative: 10.4 %
Neutro Abs: 4223 cells/uL (ref 1500–7800)
Neutrophils Relative %: 61.2 %
Platelets: 286 10*3/uL (ref 140–400)
RBC: 5.06 10*6/uL (ref 4.20–5.80)
RDW: 12.6 % (ref 11.0–15.0)
Total Lymphocyte: 25.5 %
WBC: 6.9 10*3/uL (ref 3.8–10.8)

## 2022-05-16 LAB — C-REACTIVE PROTEIN: CRP: 31.2 mg/L — ABNORMAL HIGH (ref <8.0)

## 2022-05-16 LAB — SEDIMENTATION RATE: Sed Rate: 14 mm/h (ref 0–20)

## 2022-05-17 ENCOUNTER — Encounter: Payer: Self-pay | Admitting: Orthopedic Surgery

## 2022-05-18 ENCOUNTER — Ambulatory Visit (INDEPENDENT_AMBULATORY_CARE_PROVIDER_SITE_OTHER): Payer: 59 | Admitting: Orthopedic Surgery

## 2022-05-18 DIAGNOSIS — M25561 Pain in right knee: Secondary | ICD-10-CM

## 2022-05-23 ENCOUNTER — Encounter: Payer: Self-pay | Admitting: Orthopedic Surgery

## 2022-05-23 NOTE — Progress Notes (Signed)
   Post-Op Visit Note   Patient: Ronald Carter           Date of Birth: [birth year]           MRN: [id number] Visit Date: 05/18/2022 PCP: Pcp, No   Assessment & Plan:  Chief Complaint:  Chief Complaint  Patient presents with   Right Knee - Follow-up   Visit Diagnoses:  1. Acute pain of right knee     Plan: Patient chose to leave before being seen for follow-up of gout versus infection in his right knee.  Follow-Up Instructions: No follow-ups on file.   Orders:  No orders of the defined types were placed in this encounter.  No orders of the defined types were placed in this encounter.   Imaging: No results found.  PMFS History: Patient Active Problem List   Diagnosis Date Noted   History of gout 07/15/2020   Class 2 obesity due to excess calories without serious comorbidity with body mass index (BMI) of 35.0 to 35.9 in adult 11/16/2018   Urinary hesitancy 05/02/2018   Tobacco use disorder 01/24/2018   Healthcare maintenance 01/24/2018   Anxiety 01/24/2018   Depression 01/24/2018   Past Medical History:  Diagnosis Date   Anxiety    Chronic back pain    Chronic knee pain    left   Depression    GERD (gastroesophageal reflux disease)     Family History  Problem Relation Age of Onset   COPD Mother    Diabetes Father    Heart disease Father    Heart attack Father    Fibromyalgia Sister    Healthy Sister    Healthy Sister    Hypertension Brother    Hypertension Brother    Hypertension Brother    Hypertension Brother    Hypertension Brother    Cancer Maternal Grandmother    Stomach cancer Maternal Grandmother    Prostate cancer Other     Past Surgical History:  Procedure Laterality Date   BACK SURGERY     KNEE SURGERY     Social History   Occupational History   Not on file  Tobacco Use   Smoking status: Every Day    Packs/day: 0.50    Years: 25.00    Total pack years: 12.50    Types: Cigarettes   Smokeless tobacco: Never  Substance and  Sexual Activity   Alcohol use: Not Currently   Drug use: No   Sexual activity: Yes    Birth control/protection: None

## 2022-06-23 ENCOUNTER — Encounter (HOSPITAL_COMMUNITY): Payer: Self-pay

## 2022-06-23 ENCOUNTER — Emergency Department (HOSPITAL_COMMUNITY)
Admission: EM | Admit: 2022-06-23 | Discharge: 2022-06-23 | Disposition: A | Discharge status: Home / Self Care | Payer: 59 | Attending: Emergency Medicine | Admitting: Emergency Medicine

## 2022-06-23 ENCOUNTER — Other Ambulatory Visit: Payer: Self-pay

## 2022-06-23 DIAGNOSIS — S0501XA Injury of conjunctiva and corneal abrasion without foreign body, right eye, initial encounter: Secondary | ICD-10-CM | POA: Insufficient documentation

## 2022-06-23 DIAGNOSIS — X58XXXA Exposure to other specified factors, initial encounter: Secondary | ICD-10-CM | POA: Insufficient documentation

## 2022-06-23 DIAGNOSIS — H5711 Ocular pain, right eye: Secondary | ICD-10-CM | POA: Diagnosis not present

## 2022-06-23 DIAGNOSIS — Y990 Civilian activity done for income or pay: Secondary | ICD-10-CM | POA: Insufficient documentation

## 2022-06-23 MED ORDER — FLUORESCEIN SODIUM 1 MG OP STRP
1.0000 | ORAL_STRIP | Freq: Once | OPHTHALMIC | Status: AC
  Administered 2022-06-23: 1 via OPHTHALMIC
  Filled 2022-06-23: qty 1

## 2022-06-23 MED ORDER — OXYCODONE-ACETAMINOPHEN 5-325 MG PO TABS: 1.0000 | ORAL_TABLET | Freq: Four times a day (QID) | ORAL | 0 refills | Status: AC | PRN

## 2022-06-23 MED ORDER — MOXIFLOXACIN HCL 0.5 % OP SOLN: 2.0000 [drp] | OPHTHALMIC | 0 refills | Status: AC

## 2022-06-23 MED ORDER — TETRACAINE HCL 0.5 % OP SOLN
2.0000 [drp] | Freq: Once | OPHTHALMIC | Status: AC
  Administered 2022-06-23: 2 [drp] via OPHTHALMIC
  Filled 2022-06-23: qty 4

## 2022-06-23 NOTE — Discharge Instructions (Addendum)
You are seen in the emergency department today for a right eye abrasion to the cornea.  There does not appear to be any foreign object trapped inside the eye.  Most likely you had some sheet rock dust as you mention fall into your eye gets stuck in between the contact lens and caused some level of scratching the cornea.  There does not appear to be any kind of tear present at this point, he should plan on taking the antibiotic drops that I prescribed for the next 7 days and also use pain medication as needed.  If you have any significant loss of vision you should plan to follow-up with ophthalmologist or return back to the emergency department further evaluation.

## 2022-06-23 NOTE — ED Provider Triage Note (Signed)
Emergency Medicine Provider Triage Evaluation Note  Ronald Carter , a 57 y.o. male  was evaluated in triage.  Pt complains of right eye pain that began last night.  Patient reports he feels like he has something that got stuck in his right eye.  He does wear contacts but he says that he tried to move contact last night but did not see the contact come out but as we looked at dialysis his vision is now blurry and at baseline without contacts.  Reports watery discharge from right eye and some mucus discharge.  Left eye normal with no vision changes.  No recent viral syndrome..  Review of Systems  Positive: As above Negative: As above  Physical Exam  BP (!) 147/96 (BP Location: Right Arm)   Pulse 79   Temp 98.3 F (36.8 C) (Oral)   Resp (!) 22   SpO2 100%  Gen:   Awake, no distress, uncomfortable Resp:  Normal effort MSK:   Moves extremities without difficulty Other:  PERRL.  Patient has significant injection in right eye with watery discharge.  No obvious foreign body seen on eye exam.  No orbital swelling or cellulitis.  Left eye normal.  Medical Decision Making  Medically screening exam initiated at 12:07 PM.  Appropriate orders placed.  Lawana Pai was informed that the remainder of the evaluation will be completed by another provider, this initial triage assessment does not replace that evaluation, and the importance of remaining in the ED until their evaluation is complete.     Luvenia Heller, PA-C 06/23/22 1209

## 2022-06-23 NOTE — ED Triage Notes (Signed)
C/o right eye pain with burning x1 day.  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

## 2022-06-23 NOTE — ED Provider Notes (Signed)
[place] [facility] AT [facility] Provider Note   CSN: [id number] Arrival date & time: 06/23/22  1141     History Chief Complaint  Patient presents with   Eye Problem    Ronald Carter is a 57 y.o. male.   Eye Problem Associated symptoms: discharge, photophobia and redness   Patient presents emergency department complaints of right eye pain.  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  Patient reports significant eye drainage coming from right eye that has been primarily watery.  He also reports some associated photosensitivity but denies any pain with eye movements or any significant loss of vision beyond unable to wear his contacts causing poor vision.  Patient is a contact lens wear.     Home Medications Prior to Admission medications   Medication Sig Start Date End Date Taking? Authorizing Provider  moxifloxacin (VIGAMOX) 0.5 % ophthalmic solution Place 2 drops into the right eye every 2 (two) hours for 7 days. 2 drops every 2 hours for 2 days THEN 2 drops every 6 hours for 5 days 06/23/22 06/30/22 Yes Ernesta Trabert, Samantha Crimes, PA-C  oxyCODONE-acetaminophen (PERCOCET/ROXICET) 5-325 MG tablet Take 1 tablet by mouth every 6 (six) hours as needed for severe pain. 06/23/22  Yes Luvenia Heller, PA-C  acetaminophen-codeine (TYLENOL #3) 300-30 MG tablet Take 1 tablet by mouth every 8 (eight) hours as needed for moderate pain. 01/13/22   Meredith Pel, MD  amoxicillin (AMOXIL) 500 MG tablet Take 1 tablet (500 mg total) by mouth 2 (two) times daily. 05/15/22   Magnant, Charles L, PA-C  colchicine 0.6 MG tablet Take 1 tablet (0.6 mg total) by mouth 2 (two) times daily as needed. For gout 05/15/22   Magnant, Gerrianne Scale, PA-C  HYDROcodone-acetaminophen (NORCO/VICODIN) 5-325 MG tablet Take 1 tablet by mouth at bedtime as needed for moderate pain or  severe pain. 12/18/20   Hilts, Legrand Como, MD  HYDROcodone-acetaminophen (NORCO/VICODIN) 5-325 MG tablet Take 1 tablet by mouth every 6 (six) hours as needed for moderate pain. 04/25/22   Meredith Pel, MD  omeprazole (PRILOSEC) 20 MG capsule Take 1 capsule (20 mg total) by mouth daily for 14 days. 01/26/21 02/09/21  Jeanell Sparrow, DO  ondansetron (ZOFRAN ODT) 4 MG disintegrating tablet Take 1 tablet (4 mg total) by mouth every 4 (four) hours as needed for nausea or vomiting. 01/26/21   Jeanell Sparrow, DO  phentermine (ADIPEX-P) 37.5 MG tablet TAKE 1 TABLET BY MOUTH ONCE DAILY BEFORE BREAKFAST Patient taking differently: Take 37.5 mg by mouth daily before breakfast. 10/09/20   Hilts, Legrand Como, MD  sucralfate (CARAFATE) 1 g tablet Take 1 tablet (1 g total) by mouth 4 (four) times daily -  with meals and at bedtime for 7 days. 01/26/21 02/02/21  Jeanell Sparrow, DO      Allergies    Patient has no known allergies.    Review of Systems   Review of Systems  Constitutional:  Negative for fever.  Eyes:  Positive for photophobia, pain, discharge, redness and visual disturbance.  Respiratory:  Negative for cough and shortness of breath.   Cardiovascular:  Negative for chest pain.  All other systems reviewed and are negative.   Physical Exam Updated Vital Signs BP (!) 147/96 (BP Location: Right Arm)   Pulse 79   Temp 98.3 F (36.8 C) (Oral)  Resp (!) 22   Wt 100 kg   SpO2 100%   BMI 31.63 kg/m  Physical Exam Vitals and nursing note reviewed.  Constitutional:      General: He is not in acute distress.    Appearance: He is well-developed.  HENT:     Head: Normocephalic and atraumatic.  Eyes:     General: Lids are everted, no foreign bodies appreciated.        Right eye: Discharge present.     Conjunctiva/sclera:     Right eye: Right conjunctiva is injected.     Comments: Right eye corneal injection without obvious exudate or chemosis.  I performed a Woods lamp exam with fluorescein strip  and there did not appear to be any obvious dendritic lesions or any tears within the cornea.  There did appear to be multiple areas of increased uptake that appeared to be just simple punctate lesions likely due to dust that got in the patient's eye.  Cardiovascular:     Rate and Rhythm: Normal rate and regular rhythm.     Heart sounds: No murmur heard. Pulmonary:     Effort: Pulmonary effort is normal. No respiratory distress.     Breath sounds: Normal breath sounds.  Abdominal:     Palpations: Abdomen is soft.     Tenderness: There is no abdominal tenderness.  Musculoskeletal:        General: No swelling.     Cervical back: Neck supple.  Skin:    General: Skin is warm and dry.     Capillary Refill: Capillary refill takes less than 2 seconds.  Neurological:     Mental Status: He is alert.  Psychiatric:        Mood and Affect: Mood normal.     ED Results / Procedures / Treatments   Labs (all labs ordered are listed, but only abnormal results are displayed) Labs Reviewed - No data to display  EKG None  Radiology No results found.  Procedures Procedures   Medications Ordered in ED Medications  tetracaine (PONTOCAINE) 0.5 % ophthalmic solution 2 drop (2 drops Right Eye Given by Other 06/23/22 1341)  fluorescein ophthalmic strip 1 strip (1 strip Right Eye Given by Other 06/23/22 1342)    ED Course/ Medical Decision Making/ A&P                           Medical Decision Making Risk Prescription drug management.   This patient presents to the ED for concern of right eye pain.  Differential diagnosis includes ration, retained foreign body, globe rupture, blowout fracture   Medicines ordered and prescription drug management:  I ordered medication including tetracaine for anesthesia Reevaluation of the patient after these medicines showed that the patient improved I have reviewed the patients home medicines and have made adjustments as needed   Problem List / ED  Course:  Patient reported to the emergency department complaints of right eye pain.  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  Reports he has had this pain since early this morning that woke him up and has been associated with significant right eye discharge.  Denies any difficulty moving eye reports that his vision has been more blurry but does wear contacts and is not able to wear his contacts currently.  Based on patient's clinical presentation, I performed a slit-lamp exam using tetracaine numbing  drops and a fluorescein strip with Woods lamp.  On exam, there were multiple punctate lesions throughout cornea without any obvious tears or any ruptures in the globe.  The multiple punctate lesions most likely account for the pain that patient has been experiencing but given the patient is having significant pain as well as being a contact lens wear, I sent in a prescription for him to take over the next 7 days to prevent the risk of possible infection due to contamination with likely sheet rock dust.  There was not any obvious foreign body was retained on physical exam but advised patient he is having worsening patient or lack of improvement in symptoms over the next few days that he should plan to follow-up with his primary care provider for further evaluation and possibly return to the emergency department for further treatment.  Patient was agreeable to treatment plan verbalized understand all return precautions.  Final Clinical Impression(s) / ED Diagnoses Final diagnoses:  Abrasion of right cornea, initial encounter    Rx / DC Orders ED Discharge Orders          Ordered    moxifloxacin (VIGAMOX) 0.5 % ophthalmic solution  Every 2 hours        06/23/22 1331    oxyCODONE-acetaminophen (PERCOCET/ROXICET) 5-325 MG tablet  Every 6 hours PRN        06/23/22 1331              Salomon Mast 06/23/22 1433     Rondel Baton, MD 06/23/22 1659

## 2022-07-16 ENCOUNTER — Other Ambulatory Visit: Payer: Self-pay | Admitting: Surgical

## 2022-07-16 ENCOUNTER — Telehealth: Payer: Self-pay | Admitting: Orthopedic Surgery

## 2022-07-16 MED ORDER — COLCHICINE 0.6 MG PO TABS: 0.6000 mg | ORAL_TABLET | Freq: Two times a day (BID) | ORAL | 0 refills | Status: AC | PRN

## 2022-07-16 NOTE — Telephone Encounter (Signed)
Pt called requesting gout medication. Please send to pharmacy on file. Pt phone number is [phone].

## 2022-07-16 NOTE — Telephone Encounter (Signed)
Tried calling to advise-no answer.

## 2022-07-16 NOTE — Telephone Encounter (Signed)
Sent in

## 2023-07-27 DIAGNOSIS — R350 Frequency of micturition: Secondary | ICD-10-CM | POA: Diagnosis not present

## 2023-07-27 DIAGNOSIS — R109 Unspecified abdominal pain: Secondary | ICD-10-CM | POA: Diagnosis not present

## 2023-07-28 ENCOUNTER — Other Ambulatory Visit: Payer: Self-pay | Admitting: Physician Assistant

## 2023-07-28 DIAGNOSIS — R109 Unspecified abdominal pain: Secondary | ICD-10-CM

## 2023-07-30 ENCOUNTER — Encounter: Payer: Self-pay | Admitting: Physician Assistant

## 2023-08-03 ENCOUNTER — Ambulatory Visit
Admission: RE | Admit: 2023-08-03 | Discharge: 2023-08-03 | Disposition: A | Discharge status: Home / Self Care | Payer: 59 | Source: Ambulatory Visit | Attending: Physician Assistant | Admitting: Physician Assistant

## 2023-08-03 DIAGNOSIS — R109 Unspecified abdominal pain: Secondary | ICD-10-CM

## 2023-08-03 DIAGNOSIS — I700 Atherosclerosis of aorta: Secondary | ICD-10-CM | POA: Diagnosis not present

## 2023-08-03 MED ORDER — IOPAMIDOL (ISOVUE-300) INJECTION 61%
100.0000 mL | Freq: Once | INTRAVENOUS | Status: AC | PRN
  Administered 2023-08-03: 100 mL via INTRAVENOUS

## 2023-08-23 ENCOUNTER — Other Ambulatory Visit: Payer: 59
